# Patient Record
Sex: Female | Born: 1960 | Race: Black or African American | Hispanic: No | Smoking: Never smoker
Health system: Southern US, Community
[De-identification: ages and names within clinical notes are randomized; demographics above are authoritative.]

## PROBLEM LIST (undated history)

## (undated) DIAGNOSIS — I10 Essential (primary) hypertension: Secondary | ICD-10-CM

## (undated) DIAGNOSIS — K219 Gastro-esophageal reflux disease without esophagitis: Secondary | ICD-10-CM

## (undated) DIAGNOSIS — Z973 Presence of spectacles and contact lenses: Secondary | ICD-10-CM

## (undated) HISTORY — DX: Essential (primary) hypertension: I10

---

## 2003-10-30 HISTORY — PX: TOTAL ABDOMINAL HYSTERECTOMY: SHX209

## 2003-10-30 HISTORY — PX: VAGINAL HYSTERECTOMY: SUR661

## 2011-01-23 ENCOUNTER — Ambulatory Visit: Payer: Self-pay | Admitting: Internal Medicine

## 2011-01-26 ENCOUNTER — Ambulatory Visit: Payer: Self-pay | Admitting: Internal Medicine

## 2012-02-21 ENCOUNTER — Ambulatory Visit: Payer: Self-pay | Admitting: Internal Medicine

## 2013-08-20 ENCOUNTER — Ambulatory Visit: Payer: Self-pay | Admitting: Internal Medicine

## 2013-09-22 ENCOUNTER — Ambulatory Visit: Payer: Self-pay | Admitting: Internal Medicine

## 2013-09-28 LAB — HM COLONOSCOPY: HM COLON: NORMAL

## 2013-10-15 ENCOUNTER — Ambulatory Visit: Payer: Self-pay | Admitting: Gastroenterology

## 2013-10-29 HISTORY — PX: COLONOSCOPY: SHX174

## 2014-06-28 LAB — BASIC METABOLIC PANEL
BUN: 12 mg/dL (ref 4–21)
CREATININE: 0.9 mg/dL (ref <=1.1)

## 2014-06-28 LAB — LIPID PANEL
Cholesterol: 196 mg/dL (ref 0–200)
HDL: 52 mg/dL (ref 35–70)
LDL CALC: 123 mg/dL
TRIGLYCERIDES: 105 mg/dL (ref 40–160)

## 2014-06-28 LAB — TSH: TSH: 2.4 u[IU]/mL (ref <=5.90)

## 2014-12-06 ENCOUNTER — Ambulatory Visit: Payer: Self-pay | Admitting: Internal Medicine

## 2014-12-06 LAB — HM MAMMOGRAPHY: HM MAMMO: NORMAL

## 2015-06-14 ENCOUNTER — Ambulatory Visit (INDEPENDENT_AMBULATORY_CARE_PROVIDER_SITE_OTHER): Payer: BLUE CROSS/BLUE SHIELD | Admitting: Family Medicine

## 2015-06-14 ENCOUNTER — Encounter: Payer: Self-pay | Admitting: Family Medicine

## 2015-06-14 VITALS — BP 130/80 | HR 78 | Ht 68.0 in | Wt 241.0 lb

## 2015-06-14 DIAGNOSIS — R1084 Generalized abdominal pain: Secondary | ICD-10-CM

## 2015-06-14 DIAGNOSIS — B9681 Helicobacter pylori [H. pylori] as the cause of diseases classified elsewhere: Secondary | ICD-10-CM

## 2015-06-14 DIAGNOSIS — Z8719 Personal history of other diseases of the digestive system: Secondary | ICD-10-CM | POA: Insufficient documentation

## 2015-06-14 DIAGNOSIS — R760 Raised antibody titer: Secondary | ICD-10-CM

## 2015-06-14 MED ORDER — HYOSCYAMINE SULFATE 0.125 MG SL SUBL: 0.1250 mg | SUBLINGUAL_TABLET | SUBLINGUAL | Status: DC | PRN

## 2015-06-14 NOTE — Patient Instructions (Signed)
Diet and Irritable Bowel Syndrome  No cure has been found for irritable bowel syndrome (IBS). Many options are available to treat the symptoms. Your caregiver will give you the best treatments available for your symptoms. He or she will also encourage you to manage stress and to make changes to your diet. You need to work with your caregiver and Registered Dietician to find the best combination of medicine, diet, counseling, and support to control your symptoms. The following are some diet suggestions. FOODS THAT MAKE IBS WORSE  Fatty foods, such as French fries.  Milk products, such as cheese or ice cream.  Chocolate.  Alcohol.  Caffeine (found in coffee and some sodas).  Carbonated drinks, such as soda. If certain foods cause symptoms, you should eat less of them or stop eating them. FOOD JOURNAL   Keep a journal of the foods that seem to cause distress. Write down:  What you are eating during the day and when.  What problems you are having after eating.  When the symptoms occur in relation to your meals.  What foods always make you feel badly.  Take your notes with you to your caregiver to see if you should stop eating certain foods. FOODS THAT MAKE IBS BETTER Fiber reduces IBS symptoms, especially constipation, because it makes stools soft, bulky, and easier to pass. Fiber is found in bran, bread, cereal, beans, fruit, and vegetables. Examples of foods with fiber include:  Apples.  Peaches.  Pears.  Berries.  Figs.  Broccoli, raw.  Cabbage.  Carrots.  Raw peas.  Kidney beans.  Lima beans.  Whole-grain bread.  Whole-grain cereal. Add foods with fiber to your diet a little at a time. This will let your body get used to them. Too much fiber at once might cause gas and swelling of your abdomen. This can trigger symptoms in a person with IBS. Caregivers usually recommend a diet with enough fiber to produce soft, painless bowel movements. High fiber diets may  cause gas and bloating. However, these symptoms often go away within a few weeks, as your body adjusts. In many cases, dietary fiber may lessen IBS symptoms, particularly constipation. However, it may not help pain or diarrhea. High fiber diets keep the colon mildly enlarged (distended) with the added fiber. This may help prevent spasms in the colon. Some forms of fiber also keep water in the stool, thereby preventing hard stools that are difficult to pass.  Besides telling you to eat more foods with fiber, your caregiver may also tell you to get more fiber by taking a fiber pill or drinking water mixed with a special high fiber powder. An example of this is a natural fiber laxative containing psyllium seed.  TIPS  Large meals can cause cramping and diarrhea in people with IBS. If this happens to you, try eating 4 or 5 small meals a day, or try eating less at each of your usual 3 meals. It may also help if your meals are low in fat and high in carbohydrates. Examples of carbohydrates are pasta, rice, whole-grain breads and cereals, fruits, and vegetables.  If dairy products cause your symptoms to flare up, you can try eating less of those foods. You might be able to handle yogurt better than other dairy products, because it contains bacteria that helps with digestion. Dairy products are an important source of calcium and other nutrients. If you need to avoid dairy products, be sure to talk with a Registered Dietitian about getting these nutrients   through other food sources.  Drink enough water and fluids to keep your urine clear or pale yellow. This is important, especially if you have diarrhea. FOR MORE INFORMATION  International Foundation for Functional Gastrointestinal Disorders: www.iffgd.org  National Digestive Diseases Information Clearinghouse: digestive.niddk.nih.gov Document Released: 01/05/2004 Document Revised: 01/07/2012 Document Reviewed: 01/15/2014 ExitCare Patient Information 2015  [employer]. This information is not intended to replace advice given to you by your health care provider. Make sure you discuss any questions you have with your health care provider.  

## 2015-06-14 NOTE — Progress Notes (Signed)
Name: Cheryl Mosley   MRN: [id number]    DOB: 1961/01/21   Date:06/14/2015       Progress Note  Subjective  Chief Complaint  Chief Complaint  Patient presents with  . Abdominal Pain    "feels like gas"- comes and goes in different places. had a burger and fries- started up    Abdominal Pain This is a recurrent problem. The current episode started 1 to 4 weeks ago. The onset quality is gradual. The problem has been waxing and waning. The pain is located in the left flank and generalized abdominal region. Quality: bloating with gas. The abdominal pain does not radiate. Associated symptoms include belching and flatus. Pertinent negatives include no anorexia, arthralgias, constipation, diarrhea, dysuria, fever, frequency, headaches, hematochezia, hematuria, melena, myalgias, nausea, vomiting or weight loss. The pain is aggravated by eating (certain foods). The pain is relieved by bowel movements. Treatments tried: activia/probiotics. The treatment provided no relief.    No problem-specific assessment & plan notes found for this encounter.   Past Medical History  Diagnosis Date  . Hypertension     Past Surgical History  Procedure Laterality Date  . Vaginal hysterectomy      partial  . Colonoscopy  2015    normal- cleared for 10 yrs- Dr Servando Snare    Family History  Problem Relation Age of Onset  . Hypertension Sister     Social History   Social History  . Marital Status: Married    Spouse Name: N/A  . Number of Children: N/A  . Years of Education: N/A   Occupational History  . Not on file.   Social History Main Topics  . Smoking status: Never Smoker   . Smokeless tobacco: Not on file  . Alcohol Use: No  . Drug Use: No  . Sexual Activity: Yes   Other Topics Concern  . Not on file   Social History Narrative  . No narrative on file    No Known Allergies   Review of Systems  Constitutional: Negative for fever, chills, weight loss and malaise/fatigue.  HENT:  Negative for ear discharge, ear pain and sore throat.   Eyes: Negative for blurred vision.  Respiratory: Negative for cough, sputum production, shortness of breath and wheezing.   Cardiovascular: Negative for chest pain, palpitations and leg swelling.  Gastrointestinal: Positive for abdominal pain and flatus. Negative for heartburn, nausea, vomiting, diarrhea, constipation, blood in stool, melena, hematochezia and anorexia.  Genitourinary: Negative for dysuria, urgency, frequency and hematuria.  Musculoskeletal: Negative for myalgias, back pain, joint pain, arthralgias and neck pain.  Skin: Negative for rash.  Neurological: Negative for dizziness, tingling, sensory change, focal weakness and headaches.  Endo/Heme/Allergies: Negative for environmental allergies and polydipsia. Does not bruise/bleed easily.  Psychiatric/Behavioral: Negative for depression and suicidal ideas. The patient is not nervous/anxious and does not have insomnia.      Objective  Filed Vitals:   06/14/15 1600  BP: 130/80  Pulse: 78  Height: 5\' 8"  (1.727 m)  Weight: 241 lb (109.317 kg)    Physical Exam  Constitutional: She is well-developed, well-nourished, and in no distress. No distress.  HENT:  Head: Normocephalic and atraumatic.  Right Ear: External ear normal.  Left Ear: External ear normal.  Nose: Nose normal.  Mouth/Throat: Oropharynx is clear and moist.  Eyes: Conjunctivae and EOM are normal. Pupils are equal, round, and reactive to light. Right eye exhibits no discharge. Left eye exhibits no discharge.  Neck: Normal range of motion. Neck supple.  No JVD present. No thyromegaly present.  Cardiovascular: Normal rate, regular rhythm, normal heart sounds and intact distal pulses.  Exam reveals no gallop and no friction rub.   No murmur heard. Pulmonary/Chest: Effort normal and breath sounds normal.  Abdominal: Soft. Bowel sounds are normal. She exhibits no mass. There is no tenderness. There is no  guarding.  Musculoskeletal: Normal range of motion. She exhibits no edema.  Lymphadenopathy:    She has no cervical adenopathy.  Neurological: She is alert. She has normal reflexes.  Skin: Skin is warm and dry. She is not diaphoretic.  Psychiatric: Mood and affect normal.  Nursing note and vitals reviewed.     Assessment & Plan  Problem List Items Addressed This Visit    None    Visit Diagnoses    Generalized abdominal pain    -  Primary    Relevant Medications    hyoscyamine (LEVSIN/SL) 0.125 MG SL tablet    Other Relevant Orders    POCT Urinalysis Dipstick    Hepatic function panel    Lipase    H. pylori antibody, IgG         Dr. Hayden [facility]  06/14/2015

## 2015-06-15 ENCOUNTER — Encounter: Payer: Self-pay | Admitting: Internal Medicine

## 2015-06-15 ENCOUNTER — Ambulatory Visit: Payer: Self-pay | Admitting: Family Medicine

## 2015-06-15 LAB — H. PYLORI ANTIBODY, IGG: H PYLORI IGG: 2 U/mL — AB (ref 0.0–0.8)

## 2015-06-15 LAB — HEPATIC FUNCTION PANEL
ALBUMIN: 4.1 g/dL (ref 3.5–5.5)
ALT: 21 IU/L (ref 0–32)
AST: 24 IU/L (ref 0–40)
Alkaline Phosphatase: 78 IU/L (ref 39–117)
BILIRUBIN TOTAL: 0.5 mg/dL (ref 0.0–1.2)
Bilirubin, Direct: 0.16 mg/dL (ref 0.00–0.40)
TOTAL PROTEIN: 6.9 g/dL (ref 6.0–8.5)

## 2015-06-15 LAB — LIPASE: Lipase: 23 U/L (ref 0–59)

## 2015-06-16 ENCOUNTER — Encounter: Payer: Self-pay | Admitting: Internal Medicine

## 2015-06-16 DIAGNOSIS — I10 Essential (primary) hypertension: Secondary | ICD-10-CM | POA: Insufficient documentation

## 2015-06-16 DIAGNOSIS — E782 Mixed hyperlipidemia: Secondary | ICD-10-CM | POA: Insufficient documentation

## 2015-06-16 DIAGNOSIS — M26659 Arthropathy of unspecified temporomandibular joint: Secondary | ICD-10-CM | POA: Insufficient documentation

## 2015-06-16 DIAGNOSIS — M26609 Unspecified temporomandibular joint disorder, unspecified side: Secondary | ICD-10-CM

## 2015-06-16 MED ORDER — AMOXICILLIN 500 MG PO CAPS: 500.0000 mg | ORAL_CAPSULE | Freq: Three times a day (TID) | ORAL | Status: DC

## 2015-06-16 MED ORDER — METRONIDAZOLE 500 MG PO TABS: 500.0000 mg | ORAL_TABLET | Freq: Three times a day (TID) | ORAL | Status: DC

## 2015-06-16 MED ORDER — OMEPRAZOLE 40 MG PO CPDR: 40.0000 mg | DELAYED_RELEASE_CAPSULE | Freq: Every day | ORAL | Status: DC

## 2015-06-16 NOTE — Addendum Note (Signed)
Addended by: Arthur Holms L on: 06/16/2015 11:15 AM   Modules accepted: Orders

## 2015-07-06 ENCOUNTER — Encounter: Payer: Self-pay | Admitting: Internal Medicine

## 2015-07-06 ENCOUNTER — Ambulatory Visit (INDEPENDENT_AMBULATORY_CARE_PROVIDER_SITE_OTHER): Payer: BLUE CROSS/BLUE SHIELD | Admitting: Internal Medicine

## 2015-07-06 VITALS — BP 122/76 | HR 76 | Ht 68.0 in | Wt 236.6 lb

## 2015-07-06 DIAGNOSIS — K2970 Gastritis, unspecified, without bleeding: Secondary | ICD-10-CM

## 2015-07-06 DIAGNOSIS — B9681 Helicobacter pylori [H. pylori] as the cause of diseases classified elsewhere: Secondary | ICD-10-CM

## 2015-07-06 DIAGNOSIS — I10 Essential (primary) hypertension: Secondary | ICD-10-CM | POA: Diagnosis not present

## 2015-07-06 DIAGNOSIS — K625 Hemorrhage of anus and rectum: Secondary | ICD-10-CM | POA: Diagnosis not present

## 2015-07-06 MED ORDER — AMLODIPINE BESYLATE 5 MG PO TABS: 5.0000 mg | ORAL_TABLET | Freq: Every day | ORAL | Status: DC

## 2015-07-06 MED ORDER — BENICAR HCT 40-25 MG PO TABS
1.0000 | ORAL_TABLET | Freq: Every day | ORAL | Status: DC
Start: 2015-07-06 — End: 2015-07-07

## 2015-07-06 NOTE — Progress Notes (Signed)
Date:  07/06/2015   Name:  Cheryl Mosley   DOB:  06-11-1961   MRN:  [id number]   Chief Complaint: Karie Fetch Reflux She reports no abdominal pain, no chest pain or no coughing. Patient was seen several weeks ago with abdominal discomfort H. pylori was positive she completed her antibiotics. She continues to take Prilosec daily. She says she is at least 75% improved.. Pertinent negatives include no fatigue. She continues to see scant bright red blood after a bowel movement. This is concerning to her although she had a normal colonoscopy last year. She was given stool cards but forgot to send those in..  Hypertension This is a chronic problem. The current episode started more than 1 year ago. The problem is unchanged. The problem is controlled. Pertinent negatives include no chest pain or shortness of breath. Past treatments include calcium channel blockers.   currently on Benicar and amlodipine. It appears that the Benicar is no longer on her formulary and a prior approval will need to be done or an alternative medication chosen.   Review of Systems:  Review of Systems  Constitutional: Negative for fever, diaphoresis and fatigue.  Respiratory: Negative for cough, shortness of breath and stridor.   Cardiovascular: Negative for chest pain and leg swelling.  Gastrointestinal: Positive for blood in stool. Negative for abdominal pain, diarrhea, constipation and abdominal distention.    Patient Active Problem List   Diagnosis Date Noted  . Essential (primary) hypertension 06/16/2015  . Combined fat and carbohydrate induced hyperlipemia 06/16/2015  . 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 Helicobacter pylori gastritis 06/14/2015    Prior to Admission medications   Medication Sig Start Date End Date Taking? Authorizing Provider  amLODipine (NORVASC) 5 MG tablet Take 1 tablet by mouth daily. 04/14/15  Yes Historical Provider, MD  BENICAR HCT 40-25 MG per tablet Take 1 tablet by  mouth daily. 04/14/15  Yes Historical Provider, MD  hyoscyamine (LEVSIN/SL) 0.125 MG SL tablet Place 1 tablet (0.125 mg total) under the tongue every 4 (four) hours as needed. 06/14/15  Yes Duanne Limerick, MD  omeprazole (PRILOSEC) 40 MG capsule Take 1 capsule (40 mg total) by mouth daily. 06/16/15  Yes Duanne Limerick, MD    No Known Allergies  Past Surgical History  Procedure Laterality Date  . Vaginal hysterectomy      partial  . Colonoscopy  2015    normal- cleared for 10 yrs- Dr Servando Snare    Social History  Substance Use Topics  . Smoking status: Never Smoker   . Smokeless tobacco: None  . Alcohol Use: No     Medication list has been reviewed and updated.  Physical Examination:  Physical Exam  Constitutional: She is oriented to person, place, and time. She appears well-developed. No distress.  HENT:  Head: Normocephalic and atraumatic.  Eyes: Conjunctivae are normal. Right eye exhibits no discharge. Left eye exhibits no discharge. No scleral icterus.  Cardiovascular: Normal rate, regular rhythm, S1 normal and normal heart sounds.   Pulmonary/Chest: Effort normal and breath sounds normal. No respiratory distress.  Abdominal: Soft. Normal appearance and bowel sounds are normal. There is no hepatosplenomegaly. There is no tenderness.  Musculoskeletal: Normal range of motion. She exhibits no edema.  Neurological: She is alert and oriented to person, place, and time.  Skin: Skin is warm and dry. No rash noted.  Psychiatric: She has a normal mood and affect. Her behavior is normal. Thought content normal.  Nursing note and vitals reviewed.  BP 122/76 mmHg  Pulse 76  Ht  (1.727 m)  Wt 236 lb 9.6 oz (107.321 kg)  BMI 35.98 kg/m2  Assessment and Plan: 1. Essential (primary) hypertension Controlled. Will obtain PA or substitute therapy for Benicar HCT. - amLODipine (NORVASC) 5 MG tablet; Take 1 tablet (5 mg total) by mouth daily.  Dispense: 30 tablet; Refill: 12 - BENICAR  HCT 40-25 MG per tablet; Take 1 tablet by mouth daily.  Dispense: 30 tablet; Refill: 12  2. Helicobacter pylori gastritis Antibiotics are completed. Continue omeprazole 40 mg daily for the next 3 months.  3. Rectal bleeding Continue to monitor for bleeding. If persistent will refer to GI for further evaluation. Patient is reassured that recent colonoscopy was normal   [name], MD [facility]  07/06/2015  [name], MD [facility]  07/06/2015

## 2015-07-07 ENCOUNTER — Other Ambulatory Visit: Payer: Self-pay | Admitting: Internal Medicine

## 2015-07-07 MED ORDER — VALSARTAN-HYDROCHLOROTHIAZIDE 320-25 MG PO TABS: 1.0000 | ORAL_TABLET | Freq: Every day | ORAL | Status: DC

## 2015-07-07 NOTE — Progress Notes (Signed)
Patient informed.dr 

## 2015-08-29 ENCOUNTER — Ambulatory Visit (INDEPENDENT_AMBULATORY_CARE_PROVIDER_SITE_OTHER): Payer: BLUE CROSS/BLUE SHIELD | Admitting: Internal Medicine

## 2015-08-29 ENCOUNTER — Encounter: Payer: Self-pay | Admitting: Internal Medicine

## 2015-08-29 VITALS — BP 152/98 | HR 80 | Ht 68.0 in | Wt 237.4 lb

## 2015-08-29 DIAGNOSIS — Z23 Encounter for immunization: Secondary | ICD-10-CM | POA: Diagnosis not present

## 2015-08-29 DIAGNOSIS — Z0000 Encounter for general adult medical examination without abnormal findings: Secondary | ICD-10-CM

## 2015-08-29 DIAGNOSIS — Z1239 Encounter for other screening for malignant neoplasm of breast: Secondary | ICD-10-CM

## 2015-08-29 DIAGNOSIS — E782 Mixed hyperlipidemia: Secondary | ICD-10-CM

## 2015-08-29 DIAGNOSIS — I10 Essential (primary) hypertension: Secondary | ICD-10-CM

## 2015-08-29 NOTE — Progress Notes (Signed)
Date:  08/29/2015   Name:  Cheryl Mosley   DOB:  1961-04-28   MRN:  [id number]   Chief Complaint: Annual Exam and Hypertension Cheryl Mosley is a 54 y.o. female who presents today for her Complete Annual Exam. She feels well. She reports exercising intermittently. She reports she is sleeping well. She denies any breast complaints. She is due for a mammogram. Hypertension This is a chronic problem. The current episode started more than 1 year ago. The problem is unchanged. The problem is controlled (Recently changed medications from Benicar HCT to Diovan HCT. During this transition she forgot to take amlodipine 5 mg). Pertinent negatives include no chest pain, headaches, palpitations or shortness of breath. Past treatments include angiotensin blockers and diuretics (Benicar is no longer covered on her insurance plan. Earlier this year she was switched to Benicar HCT and amlodipine. Prior to that she was on  Tribenzor). The current treatment provides moderate improvement.  Hyperlipidemia This is a chronic problem. The current episode started more than 1 year ago. The problem is controlled. Recent lipid tests were reviewed and are normal. There are no known factors aggravating her hyperlipidemia. Pertinent negatives include no chest pain, focal sensory loss, leg pain, myalgias or shortness of breath. Current antihyperlipidemic treatment includes statins. The current treatment provides significant improvement of lipids. There are no compliance problems.       Review of Systems  Constitutional: Negative for fever, chills and fatigue.  HENT: Negative for ear pain, postnasal drip, sinus pressure, sneezing, trouble swallowing and voice change.   Eyes: Negative for visual disturbance.  Respiratory: Negative for cough, chest tightness, shortness of breath and wheezing.   Cardiovascular: Negative for chest pain, palpitations and leg swelling.  Gastrointestinal: Negative for nausea, abdominal pain and  constipation.  Genitourinary: Negative for dysuria, hematuria, vaginal bleeding and vaginal discharge.  Musculoskeletal: Negative for myalgias, back pain, arthralgias and gait problem.  Skin: Negative for color change and rash.  Neurological: Negative for tremors, weakness, light-headedness and headaches.  Hematological: Negative for adenopathy.  Psychiatric/Behavioral: Negative for sleep disturbance and dysphoric mood.    Patient Active Problem List   Diagnosis Date Noted  . Essential (primary) hypertension 06/16/2015  . Combined fat and carbohydrate induced hyperlipemia 06/16/2015  . 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 Helicobacter pylori gastritis 06/14/2015    Prior to Admission medications   Medication Sig Start Date End Date Taking? Authorizing Provider  omeprazole (PRILOSEC) 40 MG capsule Take 1 capsule (40 mg total) by mouth daily. 06/16/15  Yes Duanne Limerick, MD  valsartan-hydrochlorothiazide (DIOVAN-HCT) 320-25 MG per tablet Take 1 tablet by mouth daily. 07/07/15  Yes Reubin Milan, MD  amLODipine (NORVASC) 5 MG tablet Take 1 tablet (5 mg total) by mouth daily. Patient not taking: Reported on 08/29/2015 07/06/15   Reubin Milan, MD  hyoscyamine (LEVSIN/SL) 0.125 MG SL tablet Place 1 tablet (0.125 mg total) under the tongue every 4 (four) hours as needed. Patient not taking: Reported on 08/29/2015 06/14/15   Duanne Limerick, MD    No Known Allergies  Past Surgical History  Procedure Laterality Date  . Vaginal hysterectomy      partial  . Colonoscopy  2015    normal- cleared for 10 yrs- Dr Servando Snare    Social History  Substance Use Topics  . Smoking status: Never Smoker   . Smokeless tobacco: None  . Alcohol Use: No    Medication list has been reviewed and updated.   Physical  Exam  Constitutional: She is oriented to person, place, and time. She appears well-developed and well-nourished. No distress.  HENT:  Head: Normocephalic and atraumatic.   Right Ear: Tympanic membrane and ear canal normal.  Left Ear: Tympanic membrane and ear canal normal.  Nose: Nose normal. Right sinus exhibits no maxillary sinus tenderness. Left sinus exhibits no maxillary sinus tenderness.  Mouth/Throat: Uvula is midline and oropharynx is clear and moist.  Eyes: Conjunctivae and EOM are normal. Right eye exhibits no discharge. Left eye exhibits no discharge. No scleral icterus.  Neck: Normal range of motion. Carotid bruit is not present. No erythema present. No thyromegaly present.  Cardiovascular: Normal rate, regular rhythm, normal heart sounds and normal pulses.   Pulmonary/Chest: Effort normal. No respiratory distress. She has no wheezes. Right breast exhibits no mass, no nipple discharge, no skin change and no tenderness. Left breast exhibits no mass, no nipple discharge, no skin change and no tenderness.  Abdominal: Soft. Bowel sounds are normal. There is no hepatosplenomegaly. There is no tenderness. There is no CVA tenderness.  Musculoskeletal: Normal range of motion.  Lymphadenopathy:    She has no cervical adenopathy.    She has no axillary adenopathy.  Neurological: She is alert and oriented to person, place, and time. She has normal reflexes. No cranial nerve deficit or sensory deficit.  Skin: Skin is warm, dry and intact. No rash noted.  Psychiatric: She has a normal mood and affect. Her speech is normal and behavior is normal. Thought content normal.  Nursing note and vitals reviewed.   BP 152/98 mmHg  Pulse 80  Ht 5\' 8"  (1.727 m)  Wt 237 lb 6.4 oz (107.684 kg)  BMI 36.10 kg/m2  Assessment and Plan: 1. Annual physical exam Pap smear not needed due to hysterectomy Colonoscopy is up-to-date  2. Breast cancer screening Continue self breast exams - MM DIGITAL SCREENING BILATERAL; Future  3. Essential (primary) hypertension Not controlled - continue Diovan HCT and resume amlodipine  - CBC with Differential/Platelet - Comprehensive  metabolic panel - TSH  4. Combined fat and carbohydrate induced hyperlipemia Borderline elevated LDL in the past Continue healthy diet - Lipid panel  5. Need for influenza vaccination - Flu Vaccine QUAD 36+ mos IM   Bari EdwardLaura Berglund, MD [facility]  08/29/2015

## 2015-08-30 LAB — CBC WITH DIFFERENTIAL/PLATELET
Basophils Absolute: 0 10*3/uL (ref 0.0–0.2)
Basos: 0 %
EOS (ABSOLUTE): 0.1 10*3/uL (ref 0.0–0.4)
EOS: 2 %
HEMATOCRIT: 40.6 % (ref 34.0–46.6)
HEMOGLOBIN: 13.3 g/dL (ref 11.1–15.9)
IMMATURE GRANS (ABS): 0 10*3/uL (ref 0.0–0.1)
IMMATURE GRANULOCYTES: 0 %
LYMPHS ABS: 1.5 10*3/uL (ref 0.7–3.1)
LYMPHS: 38 %
MCH: 28.2 pg (ref 26.6–33.0)
MCHC: 32.8 g/dL (ref 31.5–35.7)
MCV: 86 fL (ref 79–97)
MONOCYTES: 9 %
Monocytes Absolute: 0.3 10*3/uL (ref 0.1–0.9)
Neutrophils Absolute: 2 10*3/uL (ref 1.4–7.0)
Neutrophils: 51 %
Platelets: 309 10*3/uL (ref 150–379)
RBC: 4.72 x10E6/uL (ref 3.77–5.28)
RDW: 15.3 % (ref 12.3–15.4)
WBC: 3.9 10*3/uL (ref 3.4–10.8)

## 2015-08-30 LAB — POCT URINALYSIS DIPSTICK
BILIRUBIN UA: NEGATIVE
Glucose, UA: NEGATIVE
KETONES UA: NEGATIVE
Leukocytes, UA: NEGATIVE
Nitrite, UA: NEGATIVE
PH UA: 5
PROTEIN UA: NEGATIVE
RBC UA: NEGATIVE
SPEC GRAV UA: 1.02
Urobilinogen, UA: 0.2

## 2015-08-30 LAB — COMPREHENSIVE METABOLIC PANEL
ALBUMIN: 4.1 g/dL (ref 3.5–5.5)
ALT: 20 IU/L (ref 0–32)
AST: 21 IU/L (ref 0–40)
Albumin/Globulin Ratio: 1.3 (ref 1.1–2.5)
Alkaline Phosphatase: 80 IU/L (ref 39–117)
BUN / CREAT RATIO: 18 (ref 9–23)
BUN: 13 mg/dL (ref 6–24)
Bilirubin Total: 0.9 mg/dL (ref 0.0–1.2)
CALCIUM: 9.5 mg/dL (ref 8.7–10.2)
CO2: 27 mmol/L (ref 18–29)
CREATININE: 0.73 mg/dL (ref 0.57–1.00)
Chloride: 99 mmol/L (ref 97–106)
GFR, EST AFRICAN AMERICAN: 109 mL/min/{1.73_m2} (ref >59)
GFR, EST NON AFRICAN AMERICAN: 94 mL/min/{1.73_m2} (ref >59)
GLOBULIN, TOTAL: 3.1 g/dL (ref 1.5–4.5)
GLUCOSE: 86 mg/dL (ref 65–99)
Potassium: 4.1 mmol/L (ref 3.5–5.2)
SODIUM: 140 mmol/L (ref 136–144)
TOTAL PROTEIN: 7.2 g/dL (ref 6.0–8.5)

## 2015-08-30 LAB — LIPID PANEL
CHOL/HDL RATIO: 3.5 ratio (ref 0.0–4.4)
Cholesterol, Total: 213 mg/dL — ABNORMAL HIGH (ref 100–199)
HDL: 61 mg/dL (ref >39)
LDL CALC: 138 mg/dL — AB (ref 0–99)
Triglycerides: 69 mg/dL (ref 0–149)

## 2015-08-30 LAB — TSH: TSH: 2.45 u[IU]/mL (ref 0.450–4.500)

## 2015-09-07 ENCOUNTER — Telehealth: Payer: Self-pay

## 2015-09-07 ENCOUNTER — Other Ambulatory Visit: Payer: Self-pay | Admitting: Internal Medicine

## 2015-09-07 DIAGNOSIS — K2970 Gastritis, unspecified, without bleeding: Principal | ICD-10-CM

## 2015-09-07 DIAGNOSIS — B9681 Helicobacter pylori [H. pylori] as the cause of diseases classified elsewhere: Secondary | ICD-10-CM

## 2015-09-07 NOTE — Telephone Encounter (Signed)
She was seen in August with positive H Pylori Ab and treated. She needs to come in to discuss her symptoms exactly so I can decide the next step.

## 2015-09-07 NOTE — Telephone Encounter (Signed)
Patient called, abdominal pain still uncomfortable, wants to get ultrasound or whatever you feel appropriate next.dr

## 2015-09-14 ENCOUNTER — Ambulatory Visit
Admission: RE | Admit: 2015-09-14 | Discharge: 2015-09-14 | Disposition: A | Discharge status: Home / Self Care | Payer: BLUE CROSS/BLUE SHIELD | Source: Ambulatory Visit | Attending: Internal Medicine | Admitting: Internal Medicine

## 2015-09-14 DIAGNOSIS — K2970 Gastritis, unspecified, without bleeding: Principal | ICD-10-CM

## 2015-09-14 DIAGNOSIS — B9681 Helicobacter pylori [H. pylori] as the cause of diseases classified elsewhere: Secondary | ICD-10-CM

## 2015-09-14 DIAGNOSIS — K449 Diaphragmatic hernia without obstruction or gangrene: Secondary | ICD-10-CM | POA: Diagnosis not present

## 2015-09-14 DIAGNOSIS — K219 Gastro-esophageal reflux disease without esophagitis: Secondary | ICD-10-CM | POA: Diagnosis not present

## 2015-09-14 DIAGNOSIS — R109 Unspecified abdominal pain: Secondary | ICD-10-CM | POA: Insufficient documentation

## 2015-09-15 ENCOUNTER — Other Ambulatory Visit: Payer: Self-pay | Admitting: Internal Medicine

## 2015-09-15 DIAGNOSIS — R1013 Epigastric pain: Secondary | ICD-10-CM

## 2015-09-16 ENCOUNTER — Other Ambulatory Visit: Payer: Self-pay

## 2015-09-16 ENCOUNTER — Encounter: Payer: Self-pay | Admitting: *Deleted

## 2015-09-19 ENCOUNTER — Other Ambulatory Visit: Payer: Self-pay | Admitting: Gastroenterology

## 2015-09-19 ENCOUNTER — Ambulatory Visit: Payer: BLUE CROSS/BLUE SHIELD | Admitting: Anesthesiology

## 2015-09-19 ENCOUNTER — Encounter
Admission: RE | Disposition: A | Discharge status: Home / Self Care | Payer: Self-pay | Source: Ambulatory Visit | Attending: Gastroenterology

## 2015-09-19 ENCOUNTER — Encounter: Payer: Self-pay | Admitting: *Deleted

## 2015-09-19 ENCOUNTER — Ambulatory Visit
Admission: RE | Admit: 2015-09-19 | Discharge: 2015-09-19 | Disposition: A | Discharge status: Home / Self Care | Payer: BLUE CROSS/BLUE SHIELD | Source: Ambulatory Visit | Attending: Gastroenterology | Admitting: Gastroenterology

## 2015-09-19 DIAGNOSIS — K2970 Gastritis, unspecified, without bleeding: Secondary | ICD-10-CM | POA: Insufficient documentation

## 2015-09-19 DIAGNOSIS — A048 Other specified bacterial intestinal infections: Secondary | ICD-10-CM | POA: Insufficient documentation

## 2015-09-19 DIAGNOSIS — K2289 Other specified disease of esophagus: Secondary | ICD-10-CM | POA: Insufficient documentation

## 2015-09-19 DIAGNOSIS — Z90710 Acquired absence of both cervix and uterus: Secondary | ICD-10-CM | POA: Insufficient documentation

## 2015-09-19 DIAGNOSIS — K228 Other specified diseases of esophagus: Secondary | ICD-10-CM

## 2015-09-19 DIAGNOSIS — I10 Essential (primary) hypertension: Secondary | ICD-10-CM | POA: Insufficient documentation

## 2015-09-19 DIAGNOSIS — Z79899 Other long term (current) drug therapy: Secondary | ICD-10-CM | POA: Diagnosis not present

## 2015-09-19 DIAGNOSIS — K219 Gastro-esophageal reflux disease without esophagitis: Secondary | ICD-10-CM | POA: Diagnosis not present

## 2015-09-19 DIAGNOSIS — R12 Heartburn: Secondary | ICD-10-CM | POA: Insufficient documentation

## 2015-09-19 HISTORY — DX: Presence of spectacles and contact lenses: Z97.3

## 2015-09-19 HISTORY — PX: ESOPHAGOGASTRODUODENOSCOPY (EGD) WITH PROPOFOL: SHX5813

## 2015-09-19 HISTORY — DX: Gastro-esophageal reflux disease without esophagitis: K21.9

## 2015-09-19 SURGERY — ESOPHAGOGASTRODUODENOSCOPY (EGD) WITH PROPOFOL
Anesthesia: Monitor Anesthesia Care | Wound class: Clean Contaminated

## 2015-09-19 MED ORDER — LACTATED RINGERS IV SOLN
INTRAVENOUS | Status: DC
  Administered 2015-09-19 (×2): via INTRAVENOUS

## 2015-09-19 MED ORDER — PROPOFOL 10 MG/ML IV BOLUS
INTRAVENOUS | Status: DC | PRN
  Administered 2015-09-19 (×2): 25 mg via INTRAVENOUS
  Administered 2015-09-19: 100 mg via INTRAVENOUS

## 2015-09-19 MED ORDER — ACETAMINOPHEN 325 MG PO TABS: 325.0000 mg | ORAL_TABLET | ORAL | Status: DC | PRN

## 2015-09-19 MED ORDER — LIDOCAINE HCL (CARDIAC) 20 MG/ML IV SOLN
INTRAVENOUS | Status: DC | PRN
  Administered 2015-09-19: 20 mg via INTRAVENOUS

## 2015-09-19 MED ORDER — STERILE WATER FOR IRRIGATION IR SOLN
Status: DC | PRN
  Administered 2015-09-19: 50 mL

## 2015-09-19 MED ORDER — GLYCOPYRROLATE 0.2 MG/ML IJ SOLN
INTRAMUSCULAR | Status: DC | PRN
  Administered 2015-09-19: 0.2 mg via INTRAVENOUS

## 2015-09-19 MED ORDER — ACETAMINOPHEN 160 MG/5ML PO SOLN: 325.0000 mg | ORAL | Status: DC | PRN

## 2015-09-19 SURGICAL SUPPLY — 39 items
BALLN DILATOR 10-12 8 (BALLOONS)
BALLN DILATOR 12-15 8 (BALLOONS)
BALLN DILATOR 15-18 8 (BALLOONS)
BALLN DILATOR CRE 0-12 8 (BALLOONS)
BALLN DILATOR ESOPH 8 10 CRE (MISCELLANEOUS) IMPLANT
BALLOON DILATOR 12-15 8 (BALLOONS) IMPLANT
BALLOON DILATOR 15-18 8 (BALLOONS) IMPLANT
BALLOON DILATOR CRE 0-12 8 (BALLOONS) IMPLANT
BLOCK BITE 60FR ADLT L/F GRN (MISCELLANEOUS) ×2 IMPLANT
CANISTER SUCT 1200ML W/VALVE (MISCELLANEOUS) ×2 IMPLANT
FCP ESCP3.2XJMB 240X2.8X (MISCELLANEOUS)
FORCEPS BIOP RAD 4 LRG CAP 4 (CUTTING FORCEPS) ×2 IMPLANT
FORCEPS BIOP RJ4 240 W/NDL (MISCELLANEOUS)
FORCEPS ESCP3.2XJMB 240X2.8X (MISCELLANEOUS) IMPLANT
GOWN CVR UNV OPN BCK APRN NK (MISCELLANEOUS) ×2 IMPLANT
GOWN ISOL THUMB LOOP REG UNIV (MISCELLANEOUS) ×2
HEMOCLIP INSTINCT (CLIP) IMPLANT
INJECTOR VARIJECT VIN23 (MISCELLANEOUS) IMPLANT
KIT CO2 TUBING (TUBING) IMPLANT
KIT DEFENDO VALVE AND CONN (KITS) IMPLANT
KIT ENDO PROCEDURE OLY (KITS) ×2 IMPLANT
LIGATOR MULTIBAND 6SHOOTER MBL (MISCELLANEOUS) IMPLANT
MARKER SPOT ENDO TATTOO 5ML (MISCELLANEOUS) IMPLANT
PAD GROUND ADULT SPLIT (MISCELLANEOUS) IMPLANT
SNARE SHORT THROW 13M SML OVAL (MISCELLANEOUS) IMPLANT
SNARE SHORT THROW 30M LRG OVAL (MISCELLANEOUS) IMPLANT
SPOT EX ENDOSCOPIC TATTOO (MISCELLANEOUS)
SUCTION POLY TRAP 4CHAMBER (MISCELLANEOUS) IMPLANT
SYR INFLATION 60ML (SYRINGE) IMPLANT
TRAP SUCTION POLY (MISCELLANEOUS) IMPLANT
TUBING CONN 6MMX3.1M (TUBING)
TUBING SUCTION CONN 0.25 STRL (TUBING) IMPLANT
UNDERPAD 30X60 958B10 (PK) (MISCELLANEOUS) IMPLANT
VALVE BIOPSY ENDO (VALVE) IMPLANT
VARIJECT INJECTOR VIN23 (MISCELLANEOUS)
WATER AUXILLARY (MISCELLANEOUS) IMPLANT
WATER STERILE IRR 250ML POUR (IV SOLUTION) ×2 IMPLANT
WATER STERILE IRR 500ML POUR (IV SOLUTION) IMPLANT
WIRE CRE 18-20MM 8CM F G (MISCELLANEOUS) IMPLANT

## 2015-09-19 NOTE — H&P (Signed)
  [facility]  [address] [place], [place] [address] Phone: [phone] Fax : [phone]  Primary Care Physician:  Bari EdwardLaura Berglund, MD Primary Gastroenterologist:  Dr. Servando SnareWohl  Pre-Procedure History & Physical: HPI:  Cheryl Mosley is a 54 y.o. female is here for an endoscopy.   Past Medical History  Diagnosis Date  . Hypertension   . GERD (gastroesophageal reflux disease)   . Wears contact lenses     Past Surgical History  Procedure Laterality Date  . Vaginal hysterectomy      partial  . Colonoscopy  2015    normal- cleared for 10 yrs- Dr Servando SnareWohl    Prior to Admission medications   Medication Sig Start Date End Date Taking? Authorizing Provider  amLODipine (NORVASC) 5 MG tablet Take 1 tablet (5 mg total) by mouth daily. 07/06/15  Yes Reubin MilanLaura H Berglund, MD  Multiple Vitamins-Minerals (CENTRUM ADULTS PO) Take by mouth.   Yes Historical Provider, MD  valsartan-hydrochlorothiazide (DIOVAN-HCT) 320-25 MG per tablet Take 1 tablet by mouth daily. 07/07/15  Yes Reubin MilanLaura H Berglund, MD  hyoscyamine (LEVSIN/SL) 0.125 MG SL tablet Place 1 tablet (0.125 mg total) under the tongue every 4 (four) hours as needed. 06/14/15   Duanne Limerickeanna C Jones, MD  omeprazole (PRILOSEC) 40 MG capsule Take 1 capsule (40 mg total) by mouth daily. Patient not taking: Reported on 09/16/2015 06/16/15   Duanne Limerickeanna C Jones, MD    Allergies as of 09/16/2015  . (No Known Allergies)    Family History  Problem Relation Age of Onset  . Hypertension Sister     Social History   Social History  . Marital Status: Married    Spouse Name: N/A  . Number of Children: N/A  . Years of Education: N/A   Occupational History  . Not on file.   Social History Main Topics  . Smoking status: Never Smoker   . Smokeless tobacco: Not on file  . Alcohol Use: No  . Drug Use: No  . Sexual Activity: Yes   Other Topics Concern  . Not on file   Social History Narrative    Review of Systems: See HPI, otherwise  negative ROS  Physical Exam: BP 122/89 mmHg  Pulse 85  Temp(Src) 97.9 F (36.6 C)  Resp 16  Ht 5\' 8"  (1.727 m)  Wt 230 lb (104.327 kg)  BMI 34.98 kg/m2  SpO2 100% General:   Alert,  pleasant and cooperative in NAD Head:  Normocephalic and atraumatic. Neck:  Supple; no masses or thyromegaly. Lungs:  Clear throughout to auscultation.    Heart:  Regular rate and rhythm. Abdomen:  Soft, nontender and nondistended. Normal bowel sounds, without guarding, and without rebound.   Neurologic:  Alert and  oriented x4;  grossly normal neurologically.  Impression/Plan: Cheryl Mosley is here for an endoscopy to be performed for GERD  Risks, benefits, limitations, and alternatives regarding  endoscopy have been reviewed with the patient.  Questions have been answered.  All parties agreeable.   Darlina Rumpfaren Mahad Newstrom, MD  09/19/2015, 11:33 AM

## 2015-09-19 NOTE — Op Note (Signed)
[facility] Gastroenterology Patient Name: Cheryl Mosley Procedure Date: 09/19/2015 11:05 AM MRN: [id number] Account #: [id number] Date of Birth: 03/18/1961 Admit Type: Outpatient Age: [age] Room: [facility] ROOM 01 Gender: Female Note Status: Finalized Procedure:         Upper GI endoscopy Indications:       Heartburn Providers:         Midge Miniumarren Deshawn Skelley, MD Referring MD:      Bari EdwardLaura Berglund, MD (Referring MD) Medicines:         Propofol per Anesthesia Complications:     No immediate complications. Procedure:         Pre-Anesthesia Assessment:                    - Prior to the procedure, a History and Physical was                     performed, and patient medications and allergies were                     reviewed. The patient's tolerance of previous anesthesia                     was also reviewed. The risks and benefits of the procedure                     and the sedation options and risks were discussed with the                     patient. All questions were answered, and informed consent                     was obtained. Prior Anticoagulants: The patient has taken                     no previous anticoagulant or antiplatelet agents. ASA                     Grade Assessment: II - A patient with mild systemic                     disease. After reviewing the risks and benefits, the                     patient was deemed in satisfactory condition to undergo                     the procedure.                    After obtaining informed consent, the endoscope was passed                     under direct vision. Throughout the procedure, the                     patient's blood pressure, pulse, and oxygen saturations                     were monitored continuously. The was introduced through                     the mouth, and advanced to the second part of duodenum.  The upper GI endoscopy was accomplished without                     difficulty. The  patient tolerated the procedure well. Findings:      The Z-line was irregular and was found at the gastroesophageal junction.      Localized mild inflammation characterized by erythema was found in the       gastric antrum. Biopsies were taken with a cold forceps for histology.      The examined duodenum was normal. Impression:        - Z-line irregular, at the gastroesophageal junction.                    - Gastritis. Biopsied.                    - Normal examined duodenum. Recommendation:    - Await pathology results. Procedure Code(s): --- Professional ---                    [phone], Esophagogastroduodenoscopy, flexible, transoral;                     with biopsy, single or multiple Diagnosis Code(s): --- Professional ---                    R12, Heartburn                    K22.8, Other specified diseases of esophagus                    K29.70, Gastritis, unspecified, without bleeding CPT copyright 2014 American Medical Association. All rights reserved. The codes documented in this report are preliminary and upon coder review may  be revised to meet current compliance requirements. Midge Minium, MD 09/19/2015 11:50:41 AM This report has been signed electronically. Number of Addenda: 0 Note Initiated On: 09/19/2015 11:05 AM      [facility]

## 2015-09-19 NOTE — Anesthesia Preprocedure Evaluation (Signed)
Anesthesia Evaluation  Patient identified by MRN, date of birth, ID band  Reviewed: Allergy & Precautions, H&P , NPO status , Patient's Chart, lab work & pertinent test results  Airway Mallampati: II  TM Distance: >3 FB Neck ROM: full    Dental no notable dental hx.    Pulmonary    Pulmonary exam normal       Cardiovascular hypertension, Rhythm:regular Rate:Normal     Neuro/Psych    GI/Hepatic GERD-  ,  Endo/Other    Renal/GU      Musculoskeletal   Abdominal   Peds  Hematology   Anesthesia Other Findings   Reproductive/Obstetrics                             Anesthesia Physical Anesthesia Plan  ASA: II  Anesthesia Plan: MAC   Post-op Pain Management:    Induction:   Airway Management Planned:   Additional Equipment:   Intra-op Plan:   Post-operative Plan:   Informed Consent: I have reviewed the patients History and Physical, chart, labs and discussed the procedure including the risks, benefits and alternatives for the proposed anesthesia with the patient or authorized representative who has indicated his/her understanding and acceptance.     Plan Discussed with: CRNA  Anesthesia Plan Comments:         Anesthesia Quick Evaluation  

## 2015-09-19 NOTE — Anesthesia Postprocedure Evaluation (Signed)
Anesthesia Post Note  Patient: Cheryl Mosley  Procedure(s) Performed: Procedure(s) (LRB): ESOPHAGOGASTRODUODENOSCOPY (EGD) WITH PROPOFOL (N/A)  Patient location during evaluation: PACU Anesthesia Type: MAC Level of consciousness: awake and alert and oriented Pain management: satisfactory to patient Vital Signs Assessment: post-procedure vital signs reviewed and stable Respiratory status: spontaneous breathing, nonlabored ventilation and respiratory function stable Cardiovascular status: blood pressure returned to baseline and stable Postop Assessment: Adequate PO intake and No signs of nausea or vomiting Anesthetic complications: no    Last Vitals:  Filed Vitals:   09/19/15 1200 09/19/15 1205  BP: 130/89   Pulse: 92 81  Temp:    Resp: 17 25    Last Pain: There were no vitals filed for this visit.               [name], Lamel Mccarley J

## 2015-09-19 NOTE — Transfer of Care (Signed)
Immediate Anesthesia Transfer of Care Note  Patient: Cheryl Mosley  Procedure(s) Performed: Procedure(s): ESOPHAGOGASTRODUODENOSCOPY (EGD) WITH PROPOFOL (N/A)  Patient Location: PACU  Anesthesia Type: MAC  Level of Consciousness: awake, alert  and patient cooperative  Airway and Oxygen Therapy: Patient Spontanous Breathing and Patient connected to supplemental oxygen  Post-op Assessment: Post-op Vital signs reviewed, Patient's Cardiovascular Status Stable, Respiratory Function Stable, Patent Airway and No signs of Nausea or vomiting  Post-op Vital Signs: Reviewed and stable  Complications: No apparent anesthesia complications

## 2015-09-19 NOTE — Anesthesia Procedure Notes (Signed)
Procedure Name: MAC Performed by: Randel Hargens Pre-anesthesia Checklist: Patient identified, Emergency Drugs available, Suction available, Patient being monitored and Timeout performed Patient Re-evaluated:Patient Re-evaluated prior to inductionOxygen Delivery Method: Nasal cannula       

## 2015-09-20 ENCOUNTER — Encounter: Payer: Self-pay | Admitting: Gastroenterology

## 2015-09-20 ENCOUNTER — Telehealth: Payer: Self-pay | Admitting: Gastroenterology

## 2015-09-20 NOTE — Telephone Encounter (Signed)
Returned pt's call regarding what Dr. Servando SnareWohl discussed with pt's husband yesterday after her procedure. Advised pt we really need to wait until her pathology returns to see if there is a reason for her gastritis. Dr. Servando SnareWohl will decide what treatment option will be best for her when this returns. Pt understood and will wait until its back.

## 2015-09-20 NOTE — Telephone Encounter (Signed)
Patient called and said her husband was told by Dr.Wohl she had inflammation and she is calling to get some info about that and he report. i explained to patient the report isn't back yet and Dr.Wohl will be in the office 11/23. I did ask patient was she taking anything for acid reflux because normally that's where the inflammation comes from and she said she was not. Please call patient

## 2015-09-21 ENCOUNTER — Encounter: Payer: Self-pay | Admitting: Gastroenterology

## 2015-09-21 ENCOUNTER — Other Ambulatory Visit: Payer: Self-pay

## 2015-09-21 DIAGNOSIS — B373 Candidiasis of vulva and vagina: Secondary | ICD-10-CM

## 2015-09-21 DIAGNOSIS — B3731 Acute candidiasis of vulva and vagina: Secondary | ICD-10-CM

## 2015-09-21 DIAGNOSIS — A048 Other specified bacterial intestinal infections: Secondary | ICD-10-CM

## 2015-09-21 MED ORDER — FLUCONAZOLE 150 MG PO TABS: 150.0000 mg | ORAL_TABLET | Freq: Every day | ORAL | Status: DC

## 2015-09-21 MED ORDER — AMOXICILLIN 500 MG PO TABS: 1000.0000 mg | ORAL_TABLET | Freq: Two times a day (BID) | ORAL | Status: DC

## 2015-09-21 MED ORDER — CLARITHROMYCIN 500 MG PO TABS: 500.0000 mg | ORAL_TABLET | Freq: Two times a day (BID) | ORAL | Status: DC

## 2015-09-26 ENCOUNTER — Emergency Department: Payer: BLUE CROSS/BLUE SHIELD

## 2015-09-26 ENCOUNTER — Encounter: Payer: Self-pay | Admitting: *Deleted

## 2015-09-26 ENCOUNTER — Emergency Department
Admission: EM | Admit: 2015-09-26 | Discharge: 2015-09-26 | Disposition: A | Discharge status: Home / Self Care | Payer: BLUE CROSS/BLUE SHIELD | Attending: Emergency Medicine | Admitting: Emergency Medicine

## 2015-09-26 ENCOUNTER — Telehealth: Payer: Self-pay | Admitting: Gastroenterology

## 2015-09-26 ENCOUNTER — Other Ambulatory Visit: Payer: Self-pay

## 2015-09-26 DIAGNOSIS — R0789 Other chest pain: Secondary | ICD-10-CM

## 2015-09-26 DIAGNOSIS — M549 Dorsalgia, unspecified: Secondary | ICD-10-CM | POA: Diagnosis not present

## 2015-09-26 DIAGNOSIS — R079 Chest pain, unspecified: Secondary | ICD-10-CM | POA: Insufficient documentation

## 2015-09-26 DIAGNOSIS — R1110 Vomiting, unspecified: Secondary | ICD-10-CM | POA: Diagnosis not present

## 2015-09-26 DIAGNOSIS — R1010 Upper abdominal pain, unspecified: Secondary | ICD-10-CM | POA: Diagnosis present

## 2015-09-26 DIAGNOSIS — I10 Essential (primary) hypertension: Secondary | ICD-10-CM | POA: Insufficient documentation

## 2015-09-26 DIAGNOSIS — K8050 Calculus of bile duct without cholangitis or cholecystitis without obstruction: Secondary | ICD-10-CM

## 2015-09-26 LAB — CBC WITH DIFFERENTIAL/PLATELET
BASOS ABS: 0 10*3/uL (ref 0–0.1)
BASOS PCT: 0 %
EOS ABS: 0 10*3/uL (ref 0–0.7)
EOS PCT: 0 %
HCT: 44 % (ref 35.0–47.0)
Hemoglobin: 14.6 g/dL (ref 12.0–16.0)
LYMPHS PCT: 10 %
Lymphs Abs: 0.7 10*3/uL — ABNORMAL LOW (ref 1.0–3.6)
MCH: 28 pg (ref 26.0–34.0)
MCHC: 33.1 g/dL (ref 32.0–36.0)
MCV: 84.7 fL (ref 80.0–100.0)
Monocytes Absolute: 0.4 10*3/uL (ref 0.2–0.9)
Monocytes Relative: 5 %
Neutro Abs: 6.4 10*3/uL (ref 1.4–6.5)
Neutrophils Relative %: 85 %
PLATELETS: 301 10*3/uL (ref 150–440)
RBC: 5.2 MIL/uL (ref 3.80–5.20)
RDW: 14.3 % (ref 11.5–14.5)
WBC: 7.5 10*3/uL (ref 3.6–11.0)

## 2015-09-26 LAB — COMPREHENSIVE METABOLIC PANEL
ALBUMIN: 4.3 g/dL (ref 3.5–5.0)
ALT: 26 U/L (ref 14–54)
AST: 22 U/L (ref 15–41)
Alkaline Phosphatase: 74 U/L (ref 38–126)
Anion gap: 9 (ref 5–15)
BUN: 11 mg/dL (ref 6–20)
CHLORIDE: 101 mmol/L (ref 101–111)
CO2: 28 mmol/L (ref 22–32)
CREATININE: 0.79 mg/dL (ref 0.44–1.00)
Calcium: 9.6 mg/dL (ref 8.9–10.3)
GFR calc Af Amer: >60 mL/min (ref >60)
GFR calc non Af Amer: >60 mL/min (ref >60)
Glucose, Bld: 129 mg/dL — ABNORMAL HIGH (ref 65–99)
Potassium: 3.2 mmol/L — ABNORMAL LOW (ref 3.5–5.1)
SODIUM: 138 mmol/L (ref 135–145)
Total Bilirubin: 1.1 mg/dL (ref 0.3–1.2)
Total Protein: 8.4 g/dL — ABNORMAL HIGH (ref 6.5–8.1)

## 2015-09-26 LAB — URINALYSIS COMPLETE WITH MICROSCOPIC (ARMC ONLY)
BACTERIA UA: NONE SEEN
BILIRUBIN URINE: NEGATIVE
Glucose, UA: NEGATIVE mg/dL
Ketones, ur: NEGATIVE mg/dL
LEUKOCYTES UA: NEGATIVE
NITRITE: NEGATIVE
PH: 7 (ref 5.0–8.0)
Protein, ur: NEGATIVE mg/dL
SPECIFIC GRAVITY, URINE: 1.015 (ref 1.005–1.030)

## 2015-09-26 LAB — LIPASE, BLOOD: Lipase: 20 U/L (ref 11–51)

## 2015-09-26 LAB — TROPONIN I: Troponin I: <0.03 ng/mL (ref <0.031)

## 2015-09-26 MED ORDER — IOHEXOL 350 MG/ML SOLN
125.0000 mL | Freq: Once | INTRAVENOUS | Status: AC | PRN
  Administered 2015-09-26: 125 mL via INTRAVENOUS

## 2015-09-26 MED ORDER — HYDROMORPHONE HCL 1 MG/ML IJ SOLN
1.0000 mg | Freq: Once | INTRAMUSCULAR | Status: AC
  Administered 2015-09-26: 1 mg via INTRAVENOUS
  Filled 2015-09-26: qty 1

## 2015-09-26 MED ORDER — ONDANSETRON HCL 4 MG PO TABS: 4.0000 mg | ORAL_TABLET | Freq: Every day | ORAL | Status: DC | PRN

## 2015-09-26 MED ORDER — SODIUM CHLORIDE 0.9 % IV SOLN
1000.0000 mL | Freq: Once | INTRAVENOUS | Status: AC
  Administered 2015-09-26: 1000 mL via INTRAVENOUS

## 2015-09-26 MED ORDER — OXYCODONE-ACETAMINOPHEN 7.5-325 MG PO TABS: 1.0000 | ORAL_TABLET | ORAL | Status: DC | PRN

## 2015-09-26 MED ORDER — ONDANSETRON HCL 4 MG/2ML IJ SOLN
4.0000 mg | Freq: Once | INTRAMUSCULAR | Status: AC
  Administered 2015-09-26: 4 mg via INTRAVENOUS
  Filled 2015-09-26: qty 2

## 2015-09-26 NOTE — Telephone Encounter (Signed)
Spoke with Cheryl Mosley and as advised she went [facility] ER this morning and was found to have acute cholecystitis. She was referred to our surgeons. Advised Cheryl Mosley she still needs to take the antibiotics given to treat her h pylori. She is scheduled with Dr. Tonita CongWoodham on [place]th in [place].

## 2015-09-26 NOTE — Telephone Encounter (Signed)
Patient called and wanted to give you an update

## 2015-09-26 NOTE — ED Provider Notes (Signed)
[facility] Emergency Department Provider Note     Time seen: ----------------------------------------- 7:26 AM on 09/26/2015 -----------------------------------------    I have reviewed the triage vital signs and the nursing notes.   HISTORY  Chief Complaint Abdominal Pain    HPI Cheryl Mosley is a 54 y.o. female who presents to ER with upper abdominal pain that radiates into her back on the left side. Patient has had a history of this, recently had endoscopy which showed H. pylori but no ulcers. Currently is on antibiotics and an acids and had vomiting last night. The makes the pain better or worse, pain is been there since August.   Past Medical History  Diagnosis Date  . Hypertension   . GERD (gastroesophageal reflux disease)   . Wears contact lenses     Patient Active Problem List   Diagnosis Date Noted  . Heartburn   . Other specified diseases of esophagus   . Gastritis   . Essential (primary) hypertension 06/16/2015  . Combined fat and carbohydrate induced hyperlipemia 06/16/2015  . 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 Helicobacter pylori gastritis 06/14/2015    Past Surgical History  Procedure Laterality Date  . Vaginal hysterectomy      partial  . Colonoscopy  2015    normal- cleared for 10 yrs- Dr Servando SnareWohl  . Esophagogastroduodenoscopy (egd) with propofol N/A 09/19/2015    Procedure: ESOPHAGOGASTRODUODENOSCOPY (EGD) WITH PROPOFOL;  Surgeon: Midge Miniumarren Wohl, MD;  [facility];  Service: Endoscopy;  Laterality: N/A;    Allergies Review of patient's allergies indicates no known allergies.  Social History Social History  Substance Use Topics  . Smoking status: Never Smoker   . Smokeless tobacco: Not on file  . Alcohol Use: No    Review of Systems Constitutional: Negative for fever. Eyes: Negative for visual changes. ENT: Negative for sore throat. Cardiovascular: Negative for chest  pain. Respiratory: Negative for shortness of breath. Gastrointestinal: Positive for abdominal pain and vomiting Genitourinary: Negative for dysuria. Musculoskeletal: Positive for back pain Skin: Negative for rash. Neurological: Negative for headaches, focal weakness or numbness.  10-point ROS otherwise negative.  ____________________________________________   PHYSICAL EXAM:  VITAL SIGNS: ED Triage Vitals  Enc Vitals Group     BP --      Pulse --      Resp --      Temp --      Temp src --      SpO2 --      Weight --      Height --      Head Cir --      Peak Flow --      Pain Score --      Pain Loc --      Pain Edu? --      Excl. in GC? --     Constitutional: Alert and oriented. Well appearing and in no distress. Patient is tearful Eyes: Conjunctivae are normal. PERRL. Normal extraocular movements. ENT   Head: Normocephalic and atraumatic.   Nose: No congestion/rhinnorhea.   Mouth/Throat: Mucous membranes are moist.   Neck: No stridor. Cardiovascular: Normal rate, regular rhythm. Normal and symmetric distal pulses are present in all extremities. No murmurs, rubs, or gallops. Respiratory: Normal respiratory effort without tachypnea nor retractions. Breath sounds are clear and equal bilaterally. No wheezes/rales/rhonchi. Gastrointestinal: Soft and nontender. No distention. No abdominal bruits.  Musculoskeletal: Nontender with normal range of motion in all extremities. No joint effusions.  No lower extremity  tenderness nor edema. Neurologic:  Normal speech and language. No gross focal neurologic deficits are appreciated. Speech is normal. No gait instability. Skin:  Skin is warm, dry and intact. No rash noted. Psychiatric: Mood and affect are normal. Speech and behavior are normal. Patient exhibits appropriate insight and judgment. ____________________________________________  ED COURSE:  Pertinent labs & imaging results that were available during my care of  the patient were reviewed by me and considered in my medical decision making (see chart for details). Patient's no distress, will check basic labs and reevaluate.  EKG: Interpreted by me, rate of 90 bpm, normal PR interval, normal QRS with, normal QT interval. LVH, PVCs. Normal sinus rhythm. ____________________________________________    LABS (pertinent positives/negatives)  Labs Reviewed  CBC WITH DIFFERENTIAL/PLATELET - Abnormal; Notable for the following:    Lymphs Abs 0.7 (*)    All other components within normal limits  COMPREHENSIVE METABOLIC PANEL - Abnormal; Notable for the following:    Potassium 3.2 (*)    Glucose, Bld 129 (*)    Total Protein 8.4 (*)    All other components within normal limits  URINALYSIS COMPLETEWITH MICROSCOPIC ([facility] ONLY) - Abnormal; Notable for the following:    Color, Urine STRAW (*)    APPearance CLEAR (*)    Hgb urine dipstick 1+ (*)    Squamous Epithelial / LPF 0-5 (*)    All other components within normal limits  LIPASE, BLOOD  TROPONIN I    RADIOLOGY Images were viewed by me  CT abdomen and pelvis IMPRESSION: 1. Pericholecystic edema concerning for cholecystitis, but no calcified gallstones. Consider right upper quadrant ultrasound to evaluate for cholelithiasis and Murphy sign. 2. Aortic atherosclerosis without aneurysm or dissection. 3. Neighboring or septated right adnexal cysts, up to 4 cm. Recommend nonemergent pelvic ultrasound. 4. Additional incidental findings described above.  IMPRESSION: Cholelithiasis with gallbladder wall edema and wall thickening. Although no sonographic Eulah Pont sign is identified to confirm acute cholecystitis, this remains a concern.  No biliary duct dilatation. ____________________________________________  FINAL ASSESSMENT AND PLAN  Chest pain, abdominal pain, biliary colic  Plan: Patient with labs and imaging as dictated above. Patient evidently with biliary colic although her pain is in  the opposite side. No other identifiable cause for her pain is evident. She'll be discharged with pain medicine, antiemetics and referred to have close follow-up with general surgery for cholecystectomy.   Emily Filbert, MD   Emily Filbert, MD 09/26/15 [phone]

## 2015-09-26 NOTE — Discharge Instructions (Signed)

## 2015-09-26 NOTE — ED Notes (Signed)
Patient transported to Ultrasound 

## 2015-10-04 ENCOUNTER — Ambulatory Visit: Payer: Self-pay | Admitting: Gastroenterology

## 2015-10-04 ENCOUNTER — Encounter: Payer: Self-pay | Admitting: Internal Medicine

## 2015-10-06 ENCOUNTER — Encounter: Payer: Self-pay | Admitting: General Surgery

## 2015-10-06 ENCOUNTER — Ambulatory Visit (INDEPENDENT_AMBULATORY_CARE_PROVIDER_SITE_OTHER): Payer: BLUE CROSS/BLUE SHIELD | Admitting: General Surgery

## 2015-10-06 VITALS — BP 142/93 | HR 84 | Temp 98.7°F | Ht 69.0 in | Wt 232.8 lb

## 2015-10-06 DIAGNOSIS — K8020 Calculus of gallbladder without cholecystitis without obstruction: Secondary | ICD-10-CM | POA: Insufficient documentation

## 2015-10-06 NOTE — Progress Notes (Signed)
Patient ID: Cheryl Mosley, female   DOB: 03/17/1961, 54 y.o.   MRN: [id number]  CC: ABDOMINAL PAIN  HPI Cheryl Mosley is a 54 y.o. female presents to clinic for evaluation of abdominal pain. She has had multiple attacks over the past few months. The pain occurs after eating each time. She has been seen in urgent care and the ER. She has had episodes of nausea and vomiting with the most recent attack. She is currently symptom free. She has had a complicated workup for her symptoms. She has had gastritis and been treated for H. Pylori without resolution of her symptoms. She was offered surgical consultation at her most recent visit to the ER and she declined, but is now interested in surgery as she does not wish to have another attack. She denies fever, chills, chest pain , shortness of breath or any changes in bowel habits.  HPI  Past Medical History  Diagnosis Date  . Hypertension   . GERD (gastroesophageal reflux disease)   . Wears contact lenses     Past Surgical History  Procedure Laterality Date  . Colonoscopy  2015    normal- cleared for 10 yrs- Dr Wohl  . Esophagogastroduodenoscopy (egd) with propofol N/A 09/19/2015    Procedure: ESOPHAGOGASTRODUODENOSCOPY (EGD) WITH PROPOFOL;  Surgeon: Darren Wohl, MD;  Location: [facility];  Service: Endoscopy;  Laterality: N/A;  . Vaginal hysterectomy  2005    partial    Family History  Problem Relation Age of Onset  . Hypertension Sister     Social History Social History  Substance Use Topics  . Smoking status: Never Smoker   . Smokeless tobacco: Never Used  . Alcohol Use: No    No Known Allergies  Current Outpatient Prescriptions  Medication Sig Dispense Refill  . amLODipine (NORVASC) 5 MG tablet Take 1 tablet (5 mg total) by mouth daily. 30 tablet 12  . amoxicillin (AMOXIL) 500 MG tablet Take 2 tablets (1,000 mg total) by mouth 2 (two) times daily. 56 tablet 0  . clarithromycin (BIAXIN) 500 MG tablet Take 1 tablet  (500 mg total) by mouth 2 (two) times daily. 28 tablet 0  . omeprazole (PRILOSEC) 40 MG capsule Take 1 capsule (40 mg total) by mouth daily. 30 capsule 3  . ondansetron (ZOFRAN) 4 MG tablet Take 1 tablet (4 mg total) by mouth daily as needed for nausea or vomiting. 20 tablet 1  . oxyCODONE-acetaminophen (PERCOCET) 7.5-325 MG tablet Take 1 tablet by mouth every 4 (four) hours as needed for severe pain. 20 tablet 0  . Probiotic Product (PHILLIPS COLON HEALTH) CAPS Take 1 capsule by mouth daily.    . valsartan-hydrochlorothiazide (DIOVAN-HCT) 320-25 MG per tablet Take 1 tablet by mouth daily. 30 tablet 5  . fluconazole (DIFLUCAN) 150 MG tablet Take 1 tablet (150 mg total) by mouth daily. Take 1 tablet now and 1 tablet 3 days later (Patient not taking: Reported on 10/06/2015) 2 tablet 0   No current facility-administered medications for this visit.     Review of Systems A multi-point review of systems was asked and was negative except for the positive findings documented in the HPI.  Physical Exam Blood pressure 142/93, pulse 84, temperature 98.7 F (37.1 C), temperature source Oral, height 5' 9" (1.753 m), weight 105.597 kg (232 lb 12.8 oz). CONSTITUTIONAL: no acute distress. EYES: Pupils are equal, round, and reactive to light, Sclera are non-icteric. EARS, NOSE, MOUTH AND THROAT: The oropharynx is clear. The oral mucosa is pink   and moist. Hearing is intact to voice. LYMPH NODES:  Lymph nodes in the neck are normal. RESPIRATORY:  Lungs are clear. There is normal respiratory effort, with equal breath sounds bilaterally, and without pathologic use of accessory muscles. CARDIOVASCULAR: Heart is regular without murmurs, gallops, or rubs. GI: The abdomen is benign, soft, nontender, and nondistended. There are no palpable masses. There is no hepatosplenomegaly. There are normal bowel sounds in all quadrants. GU: Rectal deferred.   MUSCULOSKELETAL: Normal muscle strength and tone. No cyanosis or  edema.   SKIN: Turgor is good and there are no pathologic skin lesions or ulcers. NEUROLOGIC: Motor and sensation is grossly normal. Cranial nerves are grossly intact. PSYCH:  Oriented to person, place and time. Affect is normal.  Data Reviewed Images and labs reviewed, Labs all within normal limits, CT with evidence of possible cholecystitis. I have personally reviewed the patient's imaging, laboratory findings and medical records.    Assessment    Biliary Colic     Plan    Biliary Colic: Discussed in detail the pathophysiology of gallstones and when they cause symptoms. She voiced interest in surgery for the 29th of December.  I discussed the procedure in detail.  The patient was given Agricultural engineereducational material.  We discussed the risks and benefits of a laparoscopic cholecystectomy and possible cholangiogram including, but not limited to bleeding, infection, injury to surrounding structures such as the intestine or liver, bile leak, retained gallstones, need to convert to an open procedure, prolonged diarrhea, blood clots such as  DVT, common bile duct injury, anesthesia risks, and possible need for additional procedures.  The likelihood of improvement in symptoms and return to the patient's normal status is good. We discussed the typical post-operative recovery course.      Time spent with the patient was 45 minutes, with more than 50% of the time spent in face-to-face education, counseling and care coordination.     Ricarda Frameharles Anav Lammert, MD FACS General Surgeon 10/06/2015, 7:36 PM

## 2015-10-06 NOTE — Patient Instructions (Signed)
You are requesting to have your gallbladder removed. We will arrange to do this on 12/29 at [facility] with Dr. Tonita CongWoodham. If you have another "episode" prior to this, call our office and we will have you seen.  You will need to arrange to be out of work 1 week and then you may return to work with a lifting restriction no more than 15 lbs. for approximately 4 weeks after surgery.

## 2015-10-12 ENCOUNTER — Encounter: Payer: Self-pay | Admitting: Internal Medicine

## 2015-10-12 ENCOUNTER — Telehealth: Payer: Self-pay | Admitting: General Surgery

## 2015-10-12 DIAGNOSIS — M26609 Unspecified temporomandibular joint disorder, unspecified side: Secondary | ICD-10-CM | POA: Insufficient documentation

## 2015-10-12 NOTE — Telephone Encounter (Signed)
Pt advised of pre op date/time and sx date. Sx: 10/27/15--Woodham--Laparoscopic cholecystectomy. Pre op: 10/20/15- between 9-1:00pm--Phone.

## 2015-10-20 ENCOUNTER — Other Ambulatory Visit: Payer: BLUE CROSS/BLUE SHIELD

## 2015-10-20 ENCOUNTER — Encounter: Payer: Self-pay | Admitting: *Deleted

## 2015-10-20 NOTE — Patient Instructions (Signed)
  Your procedure is scheduled on: 10-27-15 (THURSDAY) Report to [facility] SAME DAY SURGERY 2ND FLOOR To find out your arrival time please call [phone] between 1PM - 3PM on 10-26-15 [facility](WEDNESDAY)  Remember: Instructions that are not followed completely may result in serious medical risk, up to and including death, or upon the discretion of your surgeon and anesthesiologist your surgery may need to be rescheduled.    _X___ 1. Do not eat food or drink liquids after midnight. No gum chewing or hard candies.     _X___ 2. No Alcohol for 24 hours before or after surgery.   ____ 3. Bring all medications with you on the day of surgery if instructed.    _X___ 4. Notify your doctor if there is any change in your medical condition     (cold, fever, infections).     Do not wear jewelry, make-up, hairpins, clips or nail polish.  Do not wear lotions, powders, or perfumes. You may wear deodorant.  Do not shave 48 hours prior to surgery. Men may shave face and neck.  Do not bring valuables to the hospital.    [facility] is not responsible for any belongings or valuables.               Contacts, dentures or bridgework may not be worn into surgery.  Leave your suitcase in the car. After surgery it may be brought to your room.  For patients admitted to the hospital, discharge time is determined by your treatment team.   Patients discharged the day of surgery will not be allowed to drive home.   Please read over the following fact sheets that you were given:      _X___ Take these medicines the morning of surgery with A SIP OF WATER:    1. AMLODIPINE  2. PRILOSEC  3. TAKE AN EXTRA PRILOSEC ON Wednesday NIGHT  4.  5.  6.  ____ Fleet Enema (as directed)   __X__ Use CHG Soap as directed  ____ Use inhalers on the day of surgery  ____ Stop metformin 2 days prior to surgery    ____ Take 1/2 of usual insulin dose the night before surgery and none on the morning of surgery.   ____ Stop  Coumadin/Plavix/aspirin-N/A  ____ Stop Anti-inflammatories-NO NSAIDS OR ASPIRIN PRODUCTS-TYLENOL OR PERCOCET OK TO TAKE   _X___ Stop supplements until after surgery-STOP PROBIOTIC NOW   ____ Bring C-Pap to the hospital.

## 2015-10-25 ENCOUNTER — Encounter
Admission: RE | Admit: 2015-10-25 | Discharge: 2015-10-25 | Disposition: A | Discharge status: Home / Self Care | Payer: BLUE CROSS/BLUE SHIELD | Source: Ambulatory Visit | Attending: General Surgery | Admitting: General Surgery

## 2015-10-25 ENCOUNTER — Telehealth: Payer: Self-pay | Admitting: General Surgery

## 2015-10-25 DIAGNOSIS — K811 Chronic cholecystitis: Secondary | ICD-10-CM | POA: Diagnosis present

## 2015-10-25 DIAGNOSIS — Z8249 Family history of ischemic heart disease and other diseases of the circulatory system: Secondary | ICD-10-CM | POA: Diagnosis not present

## 2015-10-25 DIAGNOSIS — Z90710 Acquired absence of both cervix and uterus: Secondary | ICD-10-CM | POA: Diagnosis not present

## 2015-10-25 DIAGNOSIS — K219 Gastro-esophageal reflux disease without esophagitis: Secondary | ICD-10-CM | POA: Diagnosis not present

## 2015-10-25 DIAGNOSIS — K8010 Calculus of gallbladder with chronic cholecystitis without obstruction: Secondary | ICD-10-CM | POA: Diagnosis not present

## 2015-10-25 DIAGNOSIS — I10 Essential (primary) hypertension: Secondary | ICD-10-CM | POA: Diagnosis not present

## 2015-10-25 DIAGNOSIS — Z79899 Other long term (current) drug therapy: Secondary | ICD-10-CM | POA: Diagnosis not present

## 2015-10-25 LAB — POTASSIUM: POTASSIUM: 3.3 mmol/L — AB (ref 3.5–5.1)

## 2015-10-25 MED ORDER — POTASSIUM CHLORIDE CRYS ER 20 MEQ PO TBCR: 40.0000 meq | EXTENDED_RELEASE_TABLET | Freq: Two times a day (BID) | ORAL | Status: DC

## 2015-10-25 NOTE — Pre-Procedure Instructions (Signed)
Low potassium results called and faxed to Dr. Tonita CongWoodham office (spoke to TazewellMichelle).

## 2015-10-25 NOTE — Telephone Encounter (Signed)
K-Dur BID x 2 days sent to preferred pharmacy at this time. Please let patient know that this has been called in and she is to start this immediately. Her potassium will be rechecked the morning of surgery and if ok, we can proceed with Surgery as planned.  Called patient. No answer. Left voicemail for return phone call.  If patient calls back, please let her know of this and I will be glad to speak with her if she has further questions.

## 2015-10-25 NOTE — Telephone Encounter (Signed)
Gayle from pre admit called to report patients High potassium on her labs and asked that patient be put on a supplement prior to surgery, patient has gallbladder surgery 12/29 with Dr.Woodham. Inocencio HomesGayle number at hospital pre admit [phone]

## 2015-10-25 NOTE — Telephone Encounter (Signed)
Phoned patient back incase she called back after hours and left detailed message for patient and told her to get her medications and start ASAP.

## 2015-10-26 NOTE — Telephone Encounter (Signed)
Patient called back and confirmed message left on voicemail.

## 2015-10-27 ENCOUNTER — Ambulatory Visit: Payer: BLUE CROSS/BLUE SHIELD | Admitting: Certified Registered Nurse Anesthetist

## 2015-10-27 ENCOUNTER — Encounter: Payer: Self-pay | Admitting: *Deleted

## 2015-10-27 ENCOUNTER — Ambulatory Visit: Payer: BLUE CROSS/BLUE SHIELD

## 2015-10-27 ENCOUNTER — Encounter
Admission: RE | Disposition: A | Discharge status: Home / Self Care | Payer: Self-pay | Source: Ambulatory Visit | Attending: General Surgery

## 2015-10-27 ENCOUNTER — Ambulatory Visit
Admission: RE | Admit: 2015-10-27 | Discharge: 2015-10-27 | Disposition: A | Discharge status: Home / Self Care | Payer: BLUE CROSS/BLUE SHIELD | Source: Ambulatory Visit | Attending: General Surgery | Admitting: General Surgery

## 2015-10-27 DIAGNOSIS — K8010 Calculus of gallbladder with chronic cholecystitis without obstruction: Secondary | ICD-10-CM | POA: Diagnosis not present

## 2015-10-27 DIAGNOSIS — Z90710 Acquired absence of both cervix and uterus: Secondary | ICD-10-CM | POA: Insufficient documentation

## 2015-10-27 DIAGNOSIS — Z8249 Family history of ischemic heart disease and other diseases of the circulatory system: Secondary | ICD-10-CM | POA: Insufficient documentation

## 2015-10-27 DIAGNOSIS — K219 Gastro-esophageal reflux disease without esophagitis: Secondary | ICD-10-CM | POA: Insufficient documentation

## 2015-10-27 DIAGNOSIS — K8020 Calculus of gallbladder without cholecystitis without obstruction: Secondary | ICD-10-CM | POA: Insufficient documentation

## 2015-10-27 DIAGNOSIS — Z79899 Other long term (current) drug therapy: Secondary | ICD-10-CM | POA: Insufficient documentation

## 2015-10-27 DIAGNOSIS — I10 Essential (primary) hypertension: Secondary | ICD-10-CM | POA: Insufficient documentation

## 2015-10-27 HISTORY — PX: CHOLECYSTECTOMY: SHX55

## 2015-10-27 LAB — POCT I-STAT 4, (NA,K, GLUC, HGB,HCT)
GLUCOSE: 80 mg/dL (ref 65–99)
HEMATOCRIT: 41 % (ref 36.0–46.0)
HEMOGLOBIN: 13.9 g/dL (ref 12.0–15.0)
Potassium: 4.2 mmol/L (ref 3.5–5.1)
SODIUM: 140 mmol/L (ref 135–145)

## 2015-10-27 SURGERY — LAPAROSCOPIC CHOLECYSTECTOMY
Anesthesia: General

## 2015-10-27 MED ORDER — ONDANSETRON 4 MG PO TBDP: 4.0000 mg | ORAL_TABLET | Freq: Three times a day (TID) | ORAL | Status: DC | PRN

## 2015-10-27 MED ORDER — FENTANYL CITRATE (PF) 100 MCG/2ML IJ SOLN
INTRAMUSCULAR | Status: DC | PRN
  Administered 2015-10-27: 100 ug via INTRAVENOUS
  Administered 2015-10-27 (×2): 50 ug via INTRAVENOUS

## 2015-10-27 MED ORDER — OXYCODONE HCL 5 MG/5ML PO SOLN: 5.0000 mg | Freq: Once | ORAL | Status: DC | PRN

## 2015-10-27 MED ORDER — DEXAMETHASONE SODIUM PHOSPHATE 10 MG/ML IJ SOLN
INTRAMUSCULAR | Status: DC | PRN
  Administered 2015-10-27: 10 mg via INTRAVENOUS

## 2015-10-27 MED ORDER — LIDOCAINE HCL (PF) 1 % IJ SOLN
INTRAMUSCULAR | Status: AC
  Filled 2015-10-27: qty 30

## 2015-10-27 MED ORDER — ROCURONIUM BROMIDE 100 MG/10ML IV SOLN
INTRAVENOUS | Status: DC | PRN
  Administered 2015-10-27 (×2): 5 mg via INTRAVENOUS
  Administered 2015-10-27: 10 mg via INTRAVENOUS
  Administered 2015-10-27: 30 mg via INTRAVENOUS

## 2015-10-27 MED ORDER — SUGAMMADEX SODIUM 500 MG/5ML IV SOLN
INTRAVENOUS | Status: DC | PRN
  Administered 2015-10-27: 210.4 mg via INTRAVENOUS

## 2015-10-27 MED ORDER — BUPIVACAINE HCL 0.5 % IJ SOLN
INTRAMUSCULAR | Status: DC | PRN
  Administered 2015-10-27: 10 mL

## 2015-10-27 MED ORDER — ACETAMINOPHEN 10 MG/ML IV SOLN
INTRAVENOUS | Status: AC
  Filled 2015-10-27: qty 100

## 2015-10-27 MED ORDER — BUPIVACAINE HCL (PF) 0.5 % IJ SOLN
INTRAMUSCULAR | Status: AC
  Filled 2015-10-27: qty 30

## 2015-10-27 MED ORDER — MIDAZOLAM HCL 2 MG/2ML IJ SOLN
INTRAMUSCULAR | Status: DC | PRN
  Administered 2015-10-27: 2 mg via INTRAVENOUS

## 2015-10-27 MED ORDER — FENTANYL CITRATE (PF) 100 MCG/2ML IJ SOLN: 25.0000 ug | INTRAMUSCULAR | Status: DC | PRN

## 2015-10-27 MED ORDER — PROPOFOL 10 MG/ML IV BOLUS
INTRAVENOUS | Status: DC | PRN
  Administered 2015-10-27: 180 mg via INTRAVENOUS
  Administered 2015-10-27: 40 mg via INTRAVENOUS

## 2015-10-27 MED ORDER — OXYCODONE-ACETAMINOPHEN 7.5-325 MG PO TABS: 1.0000 | ORAL_TABLET | ORAL | Status: DC | PRN

## 2015-10-27 MED ORDER — LIDOCAINE HCL (PF) 1 % IJ SOLN
INTRAMUSCULAR | Status: DC | PRN
  Administered 2015-10-27: 10 mL

## 2015-10-27 MED ORDER — LIDOCAINE HCL (CARDIAC) 20 MG/ML IV SOLN
INTRAVENOUS | Status: DC | PRN
  Administered 2015-10-27: 80 mg via INTRAVENOUS

## 2015-10-27 MED ORDER — OXYCODONE HCL 5 MG PO TABS: 5.0000 mg | ORAL_TABLET | Freq: Once | ORAL | Status: DC | PRN

## 2015-10-27 MED ORDER — CEFOXITIN SODIUM-DEXTROSE 2-2.2 GM-% IV SOLR (PREMIX)
INTRAVENOUS | Status: AC
  Administered 2015-10-27: 2000 mg via INTRAVENOUS
  Filled 2015-10-27: qty 50

## 2015-10-27 MED ORDER — DOCUSATE SODIUM 100 MG PO CAPS: 100.0000 mg | ORAL_CAPSULE | Freq: Two times a day (BID) | ORAL | Status: DC

## 2015-10-27 MED ORDER — ACETAMINOPHEN 10 MG/ML IV SOLN
INTRAVENOUS | Status: DC | PRN
Start: 2015-10-27 — End: 2015-10-27
  Administered 2015-10-27: 1000 mg via INTRAVENOUS

## 2015-10-27 MED ORDER — LACTATED RINGERS IV SOLN
INTRAVENOUS | Status: DC
  Administered 2015-10-27 (×2): via INTRAVENOUS

## 2015-10-27 MED ORDER — CHLORHEXIDINE GLUCONATE 4 % EX LIQD: 1.0000 "application " | Freq: Once | CUTANEOUS | Status: DC

## 2015-10-27 MED ORDER — ONDANSETRON HCL 4 MG/2ML IJ SOLN
INTRAMUSCULAR | Status: DC | PRN
  Administered 2015-10-27: 4 mg via INTRAVENOUS

## 2015-10-27 MED ORDER — CEFOXITIN SODIUM-DEXTROSE 2-2.2 GM-% IV SOLR (PREMIX)
2.0000 g | INTRAVENOUS | Status: AC
  Administered 2015-10-27: 2000 mg via INTRAVENOUS

## 2015-10-27 MED ORDER — KETOROLAC TROMETHAMINE 30 MG/ML IJ SOLN
INTRAMUSCULAR | Status: DC | PRN
  Administered 2015-10-27: 30 mg via INTRAVENOUS

## 2015-10-27 MED ORDER — FAMOTIDINE 20 MG PO TABS
ORAL_TABLET | ORAL | Status: AC
  Filled 2015-10-27: qty 1

## 2015-10-27 MED ORDER — CHLORHEXIDINE GLUCONATE 4 % EX LIQD
1.0000 "application " | Freq: Once | CUTANEOUS | Status: AC
  Administered 2015-10-27: 1 via TOPICAL

## 2015-10-27 MED ORDER — IOTHALAMATE MEGLUMINE 60 % INJ SOLN
INTRAMUSCULAR | Status: DC | PRN
  Administered 2015-10-27: 70 mL

## 2015-10-27 MED ORDER — SUCCINYLCHOLINE CHLORIDE 20 MG/ML IJ SOLN
INTRAMUSCULAR | Status: DC | PRN
  Administered 2015-10-27: 100 mg via INTRAVENOUS
  Administered 2015-10-27: 20 mg via INTRAVENOUS

## 2015-10-27 MED ORDER — SODIUM CHLORIDE 0.9 % IR SOLN
Status: DC | PRN
  Administered 2015-10-27: 1600 mL

## 2015-10-27 SURGICAL SUPPLY — 49 items
ADHESIVE MASTISOL STRL (MISCELLANEOUS) IMPLANT
APPLIER CLIP ROT 10 11.4 M/L (STAPLE) ×3
BAG COUNTER SPONGE EZ (MISCELLANEOUS) IMPLANT
BLADE SURG SZ11 CARB STEEL (BLADE) ×3 IMPLANT
BULB RESERV EVAC DRAIN JP 100C (MISCELLANEOUS) IMPLANT
CANISTER SUCT 1200ML W/VALVE (MISCELLANEOUS) ×6 IMPLANT
CATH CHOLANG 76X19 KUMAR (CATHETERS) ×3 IMPLANT
CHLORAPREP W/TINT 26ML (MISCELLANEOUS) ×3 IMPLANT
CLIP APPLIE ROT 10 11.4 M/L (STAPLE) ×1 IMPLANT
CLOSURE WOUND 1/2 X4 (GAUZE/BANDAGES/DRESSINGS)
CONRAY 60ML FOR OR (MISCELLANEOUS) ×6 IMPLANT
COUNTER SPONGE BAG EZ (MISCELLANEOUS)
DISSECTOR KITTNER STICK (MISCELLANEOUS) ×1 IMPLANT
DISSECTORS/KITTNER STICK (MISCELLANEOUS) ×3
DRAIN CHANNEL JP 19F (MISCELLANEOUS) IMPLANT
DRAPE SHEET LG 3/4 BI-LAMINATE (DRAPES) ×3 IMPLANT
DRSG TEGADERM 2-3/8X2-3/4 SM (GAUZE/BANDAGES/DRESSINGS) IMPLANT
DRSG TELFA 3X8 NADH (GAUZE/BANDAGES/DRESSINGS) IMPLANT
ELECT E-Z MONOPOLAR 33 (MISCELLANEOUS) ×3
ELECTRODE E-Z MONOPOLAR 33 (MISCELLANEOUS) ×1 IMPLANT
GLOVE BIO SURGEON STRL SZ7.5 (GLOVE) ×9 IMPLANT
GLOVE INDICATOR 8.0 STRL GRN (GLOVE) ×9 IMPLANT
GOWN STRL REUS W/ TWL LRG LVL3 (GOWN DISPOSABLE) ×3 IMPLANT
GOWN STRL REUS W/TWL LRG LVL3 (GOWN DISPOSABLE) ×6
IRRIGATION STRYKERFLOW (MISCELLANEOUS) IMPLANT
IRRIGATOR STRYKERFLOW (MISCELLANEOUS)
IV NS 1000ML (IV SOLUTION) ×4
IV NS 1000ML BAXH (IV SOLUTION) ×2 IMPLANT
L-HOOK LAP DISP 36CM (ELECTROSURGICAL)
LABEL OR SOLS (LABEL) ×3 IMPLANT
LHOOK LAP DISP 36CM (ELECTROSURGICAL) IMPLANT
LIQUID BAND (GAUZE/BANDAGES/DRESSINGS) IMPLANT
NEEDLE HYPO 25X1 1.5 SAFETY (NEEDLE) ×3 IMPLANT
NEEDLE VERESS 14GA 120MM (NEEDLE) ×3 IMPLANT
NS IRRIG 500ML POUR BTL (IV SOLUTION) ×3 IMPLANT
PACK LAP CHOLECYSTECTOMY (MISCELLANEOUS) ×3 IMPLANT
PAD GROUND ADULT SPLIT (MISCELLANEOUS) ×3 IMPLANT
PENCIL ELECTRO HAND CTR (MISCELLANEOUS) ×3 IMPLANT
POUCH ENDO CATCH 10MM SPEC (MISCELLANEOUS) ×3 IMPLANT
SCISSORS METZENBAUM CVD 33 (INSTRUMENTS) ×3 IMPLANT
SLEEVE ENDOPATH XCEL 5M (ENDOMECHANICALS) ×6 IMPLANT
STRAP SAFETY BODY (MISCELLANEOUS) ×3 IMPLANT
STRIP CLOSURE SKIN 1/2X4 (GAUZE/BANDAGES/DRESSINGS) IMPLANT
SUT MNCRL 4-0 (SUTURE) ×2
SUT MNCRL 4-0 27XMFL (SUTURE) ×1
SUTURE MNCRL 4-0 27XMF (SUTURE) ×1 IMPLANT
TROCAR XCEL 12X100 BLDLESS (ENDOMECHANICALS) ×3 IMPLANT
TROCAR XCEL NON-BLD 5MMX100MML (ENDOMECHANICALS) ×3 IMPLANT
TUBING INSUFFLATOR HI FLOW (MISCELLANEOUS) ×3 IMPLANT

## 2015-10-27 NOTE — Anesthesia Preprocedure Evaluation (Signed)
Anesthesia Evaluation  Patient identified by MRN, date of birth, ID band Patient awake    Reviewed: Allergy & Precautions, H&P , NPO status , Patient's Chart, lab work & pertinent test results  Airway Mallampati: III  TM Distance: >3 FB Neck ROM: full    Dental  (+) Poor Dentition, Chipped   Pulmonary neg pulmonary ROS, neg shortness of breath,    Pulmonary exam normal breath sounds clear to auscultation       Cardiovascular Exercise Tolerance: Good hypertension, (-) angina(-) Past MI Normal cardiovascular exam Rhythm:regular Rate:Normal     Neuro/Psych negative neurological ROS  negative psych ROS   GI/Hepatic Neg liver ROS, GERD  Controlled,  Endo/Other  negative endocrine ROS  Renal/GU negative Renal ROS  negative genitourinary   Musculoskeletal   Abdominal   Peds  Hematology negative hematology ROS (+)   Anesthesia Other Findings Past Medical History:   Hypertension                                                 GERD (gastroesophageal reflux disease)                       Wears contact lenses                                        Past Surgical History:   COLONOSCOPY                                      2015           Comment:normal- cleared for 10 yrs- Dr Servando SnareWohl   ESOPHAGOGASTRODUODENOSCOPY (EGD) WITH PROPOFOL  N/A 09/19/2015     Comment:Procedure: ESOPHAGOGASTRODUODENOSCOPY (EGD)               WITH PROPOFOL;  Surgeon: Midge Miniumarren Wohl, MD;                [facility];  Service:               Endoscopy;  Laterality: N/A;   VAGINAL HYSTERECTOMY                             2005           Comment:partial  BMI    Body Mass Index   34.24 kg/m 2      Reproductive/Obstetrics negative OB ROS                             Anesthesia Physical Anesthesia Plan  ASA: III  Anesthesia Plan: General ETT   Post-op Pain Management:    Induction:   Airway Management  Planned:   Additional Equipment:   Intra-op Plan:   Post-operative Plan:   Informed Consent: I have reviewed the patients History and Physical, chart, labs and discussed the procedure including the risks, benefits and alternatives for the proposed anesthesia with the patient or authorized representative who has indicated his/her understanding and acceptance.   Dental Advisory Given  Plan Discussed with: Anesthesiologist, CRNA and Surgeon  Anesthesia Plan  Comments:         Anesthesia Quick Evaluation

## 2015-10-27 NOTE — Anesthesia Postprocedure Evaluation (Signed)
Anesthesia Post Note  Patient: Cheryl Mosley  Procedure(s) Performed: Procedure(s) (LRB): LAPAROSCOPIC CHOLECYSTECTOMY WITH INTRAOPERATIVE CHOLANGIOGRAM (N/A)  Patient location during evaluation: PACU Anesthesia Type: General Level of consciousness: awake and alert Pain management: pain level controlled Vital Signs Assessment: post-procedure vital signs reviewed and stable Respiratory status: spontaneous breathing, nonlabored ventilation, respiratory function stable and patient connected to nasal cannula oxygen Cardiovascular status: blood pressure returned to baseline and stable Postop Assessment: no signs of nausea or vomiting Anesthetic complications: no    Last Vitals:  Filed Vitals:   10/27/15 1228 10/27/15 1258  BP: 127/85 137/93  Pulse: 93 91  Temp: 36.1 C   Resp: 16 16    Last Pain:  Filed Vitals:   10/27/15 1300  PainSc: 0-No pain                 Jomarie LongsJoseph K Deniro Laymon

## 2015-10-27 NOTE — Interval H&P Note (Signed)
History and Physical Interval Note:  10/27/2015 8:29 AM  Cheryl Mosley  has presented today for surgery, with the diagnosis of CHOLELITHIASIS WITHOUT CHOLECYSTITIS  The various methods of treatment have been discussed with the patient and family. After consideration of risks, benefits and other options for treatment, the patient has consented to  Procedure(s): LAPAROSCOPIC CHOLECYSTECTOMY (N/A) as a surgical intervention .  The patient's history has been reviewed, patient examined, no change in status, stable for surgery.  I have reviewed the patient's chart and labs.  Questions were answered to the patient's satisfaction.     Ricarda Frameharles Ardis Fullwood

## 2015-10-27 NOTE — Op Note (Signed)
Laparoscopic Cholecystectomy  Pre-operative Diagnosis: Biliary colic  Post-operative Diagnosis: Chronic cholecystitis  Procedure: Laparoscopic cholecystectomy with cholangiogram  Surgeon: Leonette Most T. Tonita Cong, MD FACS  Anesthesia: Gen. with endotracheal tube  Assistant: None  Procedure Details  The patient was seen again in the Holding Room. The benefits, complications, treatment options, and expected outcomes were discussed with the patient. The risks of bleeding, infection, recurrence of symptoms, failure to resolve symptoms, bile duct damage, bile duct leak, retained common bile duct stone, bowel injury, any of which could require further surgery and/or ERCP, stent, or papillotomy were reviewed with the patient. The likelihood of improving the patient's symptoms with return to their baseline status is good.  The patient and/or family concurred with the proposed plan, giving informed consent.  The patient was taken to Operating Room, identified as Cheryl Mosley and the procedure verified as Laparoscopic Cholecystectomy.  A Time Out was held and the above information confirmed.  Prior to the induction of general anesthesia, antibiotic prophylaxis was administered. VTE prophylaxis was in place. General endotracheal anesthesia was then administered and tolerated well. After the induction, the abdomen was prepped with Chloraprep and draped in the sterile fashion. The patient was positioned in the supine position.  Local anesthetic  was injected into the skin near the umbilicus and an incision made. The Veress needle was placed. Pneumoperitoneum was then created with CO2 and tolerated well without any adverse changes in the patient's vital signs. A 5mm port was placed in the periumbilical position and the abdominal cavity was explored.  Two 5-mm ports were placed in the right upper quadrant and a 12 mm epigastric port was placed all under direct vision. All skin incisions  were infiltrated with a  local anesthetic agent before making the incision and placing the trocars.   The patient was positioned  in reverse Trendelenburg, tilted slightly to the patient's left. The patient's omentum was noted to be adhesed to her anterior liver. This was taken down using accommodation of blunt dissection and hook electrocautery. After this the gallbladder was identified, the fundus grasped and retracted cephalad. The entire gallbladder was encased in a thick inflammatory rind. Adhesions were lysed bluntly. The infundibulum was grasped and retracted laterally, exposing the peritoneum overlying the triangle of Calot. This was then divided and exposed in a blunt fashion. A critical view of the cystic duct and cystic artery was obtained.  The cystic duct was clearly identified and bluntly dissected. An anterior cystic artery was identified and serially clipped and cut with endoclips and shears.  The structure that was identified as the cystic duct was noted to be extremely thick. Due to this unusual finding the decision was made to perform a cholangiogram. Using a Kumar catheter and Kumar clamp access to this duct structure was obtained and a cholangiogram was shot. The first cholangiogram only showed backfilling of the gallbladder with no for progression of contrast. The catheter was repositioned and the duct was again manipulated and a second attempt was obtained which showed spillage of contrast and the perineal cavity. The gallbladder was repositioned and the entire right upper quadrant was copiously irrigated and attempts to remove the spilled contrast. The duct was manipulated again and what appeared to be a stone stuck in the cystic duct was milked back into the gallbladder. An additional attempt at closure gram was then obtained which showed filling of the common and hepatic ducts. With confirmed anatomy, the cystic duct and a posterior cystic artery were serially clipped with laparoscopic  endoclips and cut with  EndoShears.  The gallbladder was taken from the gallbladder fossa in a retrograde fashion with the electrocautery. The gallbladder was removed and placed in an Endocatch bag. The liver bed was irrigated and inspected. Hemostasis was achieved with the electrocautery. Copious irrigation was utilized and was repeatedly aspirated until clear.  The gallbladder and Endocatch sac were then removed through the epigastric port site. The port site had to be bluntly enlarged with a Kelly forceps to allow for removal of the gallbladder.  Inspection of the right upper quadrant was performed. No bleeding, bile duct injury or leak, or bowel injury was noted. Pneumoperitoneum was released.   4-0 subcuticular Monocryl was used to close the skin. Steristrips and Mastisol and sterile dressings were  applied.  The patient was then extubated and brought to the recovery room in stable condition. Sponge, lap, and needle counts were correct at closure and at the conclusion of the case.   Findings: Chronic Cholecystitis   Estimated Blood Loss: 20 mL's         Drains: None         Specimens: Gallbladder           Complications: none               Cheryl Flynn T. Tonita CongWoodham, MD, FACS

## 2015-10-27 NOTE — H&P (View-Only) (Signed)
Patient ID: Cheryl Mosley, female   DOB: 08-Nov-1960, 54 y.o.   MRN: [id number]  CC: ABDOMINAL PAIN  HPI Cheryl Mosley is a 54 y.o. female presents to clinic for evaluation of abdominal pain. She has had multiple attacks over the past few months. The pain occurs after eating each time. She has been seen in urgent care and the ER. She has had episodes of nausea and vomiting with the most recent attack. She is currently symptom free. She has had a complicated workup for her symptoms. She has had gastritis and been treated for H. Pylori without resolution of her symptoms. She was offered surgical consultation at her most recent visit to the ER and she declined, but is now interested in surgery as she does not wish to have another attack. She denies fever, chills, chest pain , shortness of breath or any changes in bowel habits.  HPI  Past Medical History  Diagnosis Date  . Hypertension   . GERD (gastroesophageal reflux disease)   . Wears contact lenses     Past Surgical History  Procedure Laterality Date  . Colonoscopy  2015    normal- cleared for 10 yrs- Dr Servando Snare  . Esophagogastroduodenoscopy (egd) with propofol N/A 09/19/2015    Procedure: ESOPHAGOGASTRODUODENOSCOPY (EGD) WITH PROPOFOL;  Surgeon: Midge Minium, MD;  Location: [facility];  Service: Endoscopy;  Laterality: N/A;  . Vaginal hysterectomy  2005    partial    Family History  Problem Relation Age of Onset  . Hypertension Sister     Social History Social History  Substance Use Topics  . Smoking status: Never Smoker   . Smokeless tobacco: Never Used  . Alcohol Use: No    No Known Allergies  Current Outpatient Prescriptions  Medication Sig Dispense Refill  . amLODipine (NORVASC) 5 MG tablet Take 1 tablet (5 mg total) by mouth daily. 30 tablet 12  . amoxicillin (AMOXIL) 500 MG tablet Take 2 tablets (1,000 mg total) by mouth 2 (two) times daily. 56 tablet 0  . clarithromycin (BIAXIN) 500 MG tablet Take 1 tablet  (500 mg total) by mouth 2 (two) times daily. 28 tablet 0  . omeprazole (PRILOSEC) 40 MG capsule Take 1 capsule (40 mg total) by mouth daily. 30 capsule 3  . ondansetron (ZOFRAN) 4 MG tablet Take 1 tablet (4 mg total) by mouth daily as needed for nausea or vomiting. 20 tablet 1  . oxyCODONE-acetaminophen (PERCOCET) 7.5-325 MG tablet Take 1 tablet by mouth every 4 (four) hours as needed for severe pain. 20 tablet 0  . Probiotic Product (PHILLIPS COLON HEALTH) CAPS Take 1 capsule by mouth daily.    . valsartan-hydrochlorothiazide (DIOVAN-HCT) 320-25 MG per tablet Take 1 tablet by mouth daily. 30 tablet 5  . fluconazole (DIFLUCAN) 150 MG tablet Take 1 tablet (150 mg total) by mouth daily. Take 1 tablet now and 1 tablet 3 days later (Patient not taking: Reported on 10/06/2015) 2 tablet 0   No current facility-administered medications for this visit.     Review of Systems A multi-point review of systems was asked and was negative except for the positive findings documented in the HPI.  Physical Exam Blood pressure 142/93, pulse 84, temperature 98.7 F (37.1 C), temperature source Oral, height  (1.753 m), weight 105.597 kg (232 lb 12.8 oz). CONSTITUTIONAL: no acute distress. EYES: Pupils are equal, round, and reactive to light, Sclera are non-icteric. EARS, NOSE, MOUTH AND THROAT: The oropharynx is clear. The oral mucosa is pink  and moist. Hearing is intact to voice. LYMPH NODES:  Lymph nodes in the neck are normal. RESPIRATORY:  Lungs are clear. There is normal respiratory effort, with equal breath sounds bilaterally, and without pathologic use of accessory muscles. CARDIOVASCULAR: Heart is regular without murmurs, gallops, or rubs. GI: The abdomen is benign, soft, nontender, and nondistended. There are no palpable masses. There is no hepatosplenomegaly. There are normal bowel sounds in all quadrants. GU: Rectal deferred.   MUSCULOSKELETAL: Normal muscle strength and tone. No cyanosis or  edema.   SKIN: Turgor is good and there are no pathologic skin lesions or ulcers. NEUROLOGIC: Motor and sensation is grossly normal. Cranial nerves are grossly intact. PSYCH:  Oriented to person, place and time. Affect is normal.  Data Reviewed Images and labs reviewed, Labs all within normal limits, CT with evidence of possible cholecystitis. I have personally reviewed the patient's imaging, laboratory findings and medical records.    Assessment    Biliary Colic     Plan    Biliary Colic: Discussed in detail the pathophysiology of gallstones and when they cause symptoms. She voiced interest in surgery for the 29th of December.  I discussed the procedure in detail.  The patient was given Agricultural engineereducational material.  We discussed the risks and benefits of a laparoscopic cholecystectomy and possible cholangiogram including, but not limited to bleeding, infection, injury to surrounding structures such as the intestine or liver, bile leak, retained gallstones, need to convert to an open procedure, prolonged diarrhea, blood clots such as  DVT, common bile duct injury, anesthesia risks, and possible need for additional procedures.  The likelihood of improvement in symptoms and return to the patient's normal status is good. We discussed the typical post-operative recovery course.      Time spent with the patient was 45 minutes, with more than 50% of the time spent in face-to-face education, counseling and care coordination.     Cheryl Frameharles Natara Monfort, MD FACS General Surgeon 10/06/2015, 7:36 PM

## 2015-10-27 NOTE — Brief Op Note (Signed)
10/27/2015  10:53 AM  PATIENT:  Caprice RenshawNorene K Sherley  54 y.o. female  PRE-OPERATIVE DIAGNOSIS:  biliary colic  POST-OPERATIVE DIAGNOSIS:  chronic cholecystitis  PROCEDURE:  Procedure(s): LAPAROSCOPIC CHOLECYSTECTOMY WITH INTRAOPERATIVE CHOLANGIOGRAM (N/A)  SURGEON:  Surgeon(s) and Role:    * Ricarda Frameharles Banyan Goodchild, MD - Primary  PHYSICIAN ASSISTANT:   ASSISTANTS: none   ANESTHESIA:   general  EBL:  Total I/O In: 1000 [I.V.:1000] Out: -   BLOOD ADMINISTERED:none  DRAINS: none   LOCAL MEDICATIONS USED:  MARCAINE   , XYLOCAINE  and Amount: 20 ml  SPECIMEN:  Source of Specimen:  gallbladder  DISPOSITION OF SPECIMEN:  PATHOLOGY  COUNTS:  YES  TOURNIQUET:  * No tourniquets in log *  DICTATION: .Dragon Dictation  PLAN OF CARE: Discharge to home after PACU  PATIENT DISPOSITION:  PACU - hemodynamically stable.   Delay start of Pharmacological VTE agent (>24hrs) due to surgical blood loss or risk of bleeding: not applicable

## 2015-10-27 NOTE — Transfer of Care (Signed)
Immediate Anesthesia Transfer of Care Note  Patient: Cheryl Mosley  Procedure(s) Performed: Procedure(s): LAPAROSCOPIC CHOLECYSTECTOMY WITH INTRAOPERATIVE CHOLANGIOGRAM (N/A)  Patient Location: PACU  Anesthesia Type:General  Level of Consciousness: sedated  Airway & Oxygen Therapy: Patient Spontanous Breathing and Patient connected to face mask oxygen  Post-op Assessment: Report given to RN and Post -op Vital signs reviewed and stable  Post vital signs: Reviewed and stable  Last Vitals:  Filed Vitals:   10/27/15 0807  BP: 145/100  Pulse: 76  Temp: 36.7 C  Resp: 16    Complications: No anesthetic complications

## 2015-10-27 NOTE — Discharge Instructions (Signed)
Laparoscopic Cholecystectomy, Care After Refer to this sheet in the next few weeks. These instructions provide you with information about caring for yourself after your procedure. Your health care provider may also give you more specific instructions. Your treatment has been planned according to current medical practices, but problems sometimes occur. Call your health care provider if you have any problems or questions after your procedure. WHAT TO EXPECT AFTER THE PROCEDURE After your procedure, it is common to have:  Pain at your incision sites. You will be given pain medicines to control your pain.  Mild nausea or vomiting. This should improve after the first 24 hours.  Bloating and possible shoulder pain from the gas that was used during the procedure. This will improve after the first 24 hours. HOME CARE INSTRUCTIONS Incision Care  Follow instructions from your health care provider about how to take care of your incisions. Make sure you:  Wash your hands with soap and water before you change your bandage (dressing). If soap and water are not available, use hand sanitizer.  Change your dressing as told by your health care provider.  Leave stitches (sutures), skin glue, or adhesive strips in place. These skin closures may need to be in place for 2 weeks or longer. If adhesive strip edges start to loosen and curl up, you may trim the loose edges. Do not remove adhesive strips completely unless your health care provider tells you to do that.  Do not take baths, swim, or use a hot tub until your health care provider approves. Ask your health care provider if you can take showers. You may only be allowed to take sponge baths for bathing. General Instructions  Take over-the-counter and prescription medicines only as told by your health care provider.  Do not drive or operate heavy machinery while taking prescription pain medicine.  Return to your normal diet as told by your health care  provider.  Do not lift anything that is heavier than 10 lb (4.5 kg).  Do not play contact sports for one week or until your health care provider approves. SEEK MEDICAL CARE IF:   You have redness, swelling, or pain at the site of your incision.  You have fluid, blood, or pus coming from your incision.  You notice a bad smell coming from your incision area.  Your surgical incisions break open.  You have a fever. SEEK IMMEDIATE MEDICAL CARE IF:  You develop a rash.  You have difficulty breathing.  You have chest pain.  You have increasing pain in your shoulders (shoulder strap areas).  You faint or have dizzy episodes while you are standing.  You have severe pain in your abdomen.  You have nausea or vomiting that lasts for more than one day.   This information is not intended to replace advice given to you by your health care provider. Make sure you discuss any questions you have with your health care provider.   Document Released: 10/15/2005 Document Revised: 07/06/2015 Document Reviewed: 05/27/2013       AMBULATORY SURGERY  DISCHARGE INSTRUCTIONS   1) The drugs that you were given will stay in your system until tomorrow so for the next 24 hours you should not:  A) Drive an automobile B) Make any legal decisions C) Drink any alcoholic beverage   2) You may resume regular meals tomorrow.  Today it is better to start with liquids and gradually work up to solid foods.  You may eat anything you prefer, but it is  better to start with liquids, then soup and crackers, and gradually work up to solid foods.   3) Please notify your doctor immediately if you have any unusual bleeding, trouble breathing, redness and pain at the surgery site, drainage, fever, or pain not relieved by medication. 4)   5) Your post-operative visit with Dr.                                     is: Date:                        Time:    Please call to schedule your post-operative  visit.  6) Additional Instructions: Risk analyst Patient Education [employer]

## 2015-10-28 LAB — SURGICAL PATHOLOGY

## 2015-11-01 ENCOUNTER — Other Ambulatory Visit: Payer: Self-pay

## 2015-11-01 ENCOUNTER — Telehealth: Payer: Self-pay

## 2015-11-01 NOTE — Telephone Encounter (Signed)
Post discharge call to patient made at this time. Pain is controlled at this time. Pt has had some dizziness, bloating, and slight nausea today. Denies fever, vomiting, and constipation. Goes on to further say that she has had 1 bowel movement since surgery. I explained to patient that she could still need to move her bowels to get this bloating feeling gone. Encouraged use of Miralax 17g x 2 doses 6 hours apart today followed by Dulcolax 5mg  tablet x 2 doses. If she is still having these symptoms or does not have success with a bowel movement after this, to please call and speak with a nurse. Also, encouraged use of Ibuprofen to help with pain as patient is back to work and is not able to take Narcotic pain medication. Informed her that we could give her a work note for half days or for the week if she needed to go home as she is only 5 days out from surgery.   Asked patient to call back immediately if she develops a fever, abdominal pain, or nausea/vomiting becomes worse.  Confirmed patient appointment scheduled. Encouraged patient to call with any questions that arise prior to appointment.

## 2015-11-10 ENCOUNTER — Ambulatory Visit (INDEPENDENT_AMBULATORY_CARE_PROVIDER_SITE_OTHER): Payer: BLUE CROSS/BLUE SHIELD | Admitting: General Surgery

## 2015-11-10 ENCOUNTER — Telehealth: Payer: Self-pay | Admitting: Gastroenterology

## 2015-11-10 ENCOUNTER — Encounter: Payer: Self-pay | Admitting: General Surgery

## 2015-11-10 VITALS — BP 151/93 | HR 45 | Temp 98.3°F | Ht 69.0 in | Wt 233.0 lb

## 2015-11-10 DIAGNOSIS — Z4889 Encounter for other specified surgical aftercare: Secondary | ICD-10-CM

## 2015-11-10 NOTE — Telephone Encounter (Signed)
Please call patient. She needs an appointment for h. Pylori. She recently had her gallbladder removed. She also would like to talk to you regarding some of the medication that she is taking.

## 2015-11-10 NOTE — Telephone Encounter (Signed)
LVM for pt to return my call.

## 2015-11-10 NOTE — Patient Instructions (Addendum)
Please give us a call if you notice any redness, fever, and/or drainage.  At this time you are able to eat whatever you'll like to eat. Remember that if you eat fatty foods, you'll probably have diarrhea. This is normal so don't be alarmed and this will eventually stop. On your scar you are able to apply vitamin E or Mederma.

## 2015-11-10 NOTE — Progress Notes (Signed)
Outpatient Surgical Follow Up  11/10/2015  Cheryl Mosley is an 55 y.o. female.   Chief Complaint  Patient presents with  . Routine Post Op    HPI:  55 year old female returns to clinic 2 weeks status post laparoscopic cholecystectomy. Patient states that the pain she's had before surgery is now completely gone. She's been very happy with her surgical care. She denies any fevers, chills, nausea, vomiting, diarrhea, constipation, chest pain, short of breath. Patient has already return to work.  Past Medical History  Diagnosis Date  . Hypertension   . GERD (gastroesophageal reflux disease)   . Wears contact lenses     Past Surgical History  Procedure Laterality Date  . Colonoscopy  2015    normal- cleared for 10 yrs- Dr Servando SnareWohl  . Esophagogastroduodenoscopy (egd) with propofol N/A 09/19/2015    Procedure: ESOPHAGOGASTRODUODENOSCOPY (EGD) WITH PROPOFOL;  Surgeon: Midge Miniumarren Wohl, MD;  [facility];  Service: Endoscopy;  Laterality: N/A;  . Vaginal hysterectomy  2005    partial  . Cholecystectomy N/A 10/27/2015    Procedure: LAPAROSCOPIC CHOLECYSTECTOMY WITH INTRAOPERATIVE CHOLANGIOGRAM;  Surgeon: Ricarda Frameharles Dorothie Wah, MD;  Location: [facility];  Service: General;  Laterality: N/A;    Family History  Problem Relation Age of Onset  . Hypertension Sister     Social History:  reports that she has never smoked. She has never used smokeless tobacco. She reports that she does not drink alcohol or use illicit drugs.  Allergies: No Known Allergies  Medications reviewed.    ROS  a multipoint review of systems was completed. All pertinent positives and negatives within the history of present illness remainder negative.   BP 151/93 mmHg  Pulse 45  Temp(Src) 98.3 F (36.8 C) (Oral)  Ht 5\' 9"  (1.753 m)  Wt 105.688 kg (233 lb)  BMI 34.39 kg/m2  Physical Exam   Gen.: No acute distress Chest: Clear to auscultation Heart: Regular rate and rhythm Abdomen: Soft, nontender,  nondistended. Well approximated laparoscopic cholecystectomy incisions with Dermabond still in place.   No results found for this or any previous visit (from the past 48 hour(s)). No results found.  Assessment/Plan:  1. Aftercare following surgery  55 year old female status post laparoscopic cholecystectomy. Pathology reviewed with the patient. Discussed signs and symptoms of infection as well as indications for returning to clinic. Patient voiced understanding and will follow up on an as-needed basis.     Ricarda Frameharles Zain Lankford, MD FACS General Surgeon  11/10/2015,12:15 PM

## 2015-11-11 ENCOUNTER — Other Ambulatory Visit: Payer: Self-pay

## 2015-11-11 DIAGNOSIS — A048 Other specified bacterial intestinal infections: Secondary | ICD-10-CM

## 2015-11-11 NOTE — Telephone Encounter (Signed)
Pt returned call regarding her previous treatment for h pylori. Advised pt we repeat stool cultures 6 weeks after she completes her medication. Ordered stool culture and made an appt for pt to follow up with Dr. Servando SnareWohl on Wednesday, Jan 18th.

## 2015-11-16 ENCOUNTER — Ambulatory Visit (INDEPENDENT_AMBULATORY_CARE_PROVIDER_SITE_OTHER): Payer: BLUE CROSS/BLUE SHIELD | Admitting: Gastroenterology

## 2015-11-16 ENCOUNTER — Encounter: Payer: Self-pay | Admitting: Gastroenterology

## 2015-11-16 ENCOUNTER — Encounter: Payer: Self-pay | Admitting: Internal Medicine

## 2015-11-16 ENCOUNTER — Ambulatory Visit (INDEPENDENT_AMBULATORY_CARE_PROVIDER_SITE_OTHER): Payer: BLUE CROSS/BLUE SHIELD | Admitting: Internal Medicine

## 2015-11-16 VITALS — BP 131/81 | HR 75 | Temp 98.6°F | Ht 69.0 in | Wt 236.0 lb

## 2015-11-16 VITALS — BP 126/76 | HR 72 | Ht 68.0 in | Wt 236.0 lb

## 2015-11-16 DIAGNOSIS — K2970 Gastritis, unspecified, without bleeding: Secondary | ICD-10-CM | POA: Diagnosis not present

## 2015-11-16 DIAGNOSIS — K219 Gastro-esophageal reflux disease without esophagitis: Secondary | ICD-10-CM

## 2015-11-16 DIAGNOSIS — I10 Essential (primary) hypertension: Secondary | ICD-10-CM

## 2015-11-16 LAB — H. PYLORI ANTIGEN, STOOL: H pylori Ag, Stl: NEGATIVE

## 2015-11-16 MED ORDER — LOSARTAN POTASSIUM-HCTZ 100-25 MG PO TABS: 1.0000 | ORAL_TABLET | Freq: Every day | ORAL | Status: DC

## 2015-11-16 NOTE — Progress Notes (Signed)
Date:  11/16/2015   Name:  Cheryl Mosley   DOB:  1961/07/18   MRN:  [id number]   Chief Complaint: Hypertension Hypertension This is a chronic problem. The current episode started more than 1 year ago. The problem is unchanged. The problem is controlled. Pertinent negatives include no chest pain, palpitations or shortness of breath. Past treatments include calcium channel blockers, angiotensin blockers and diuretics. The current treatment provides significant improvement. There are no compliance problems.   She does think that she has developed a slight cough.  Also the pill is very large and hard to swallow.  Gastritis - Seeing Dr. Servando Snare.  Treated in the past for H Pylori.  EGD showed persistent gastritis and H pylori for which she was treated again.  She has an appointment with him today to discuss treatment other than omeprazole 40 mg daily.   Review of Systems  Constitutional: Negative for chills and fatigue.  HENT: Negative for hearing loss.   Eyes: Negative for visual disturbance.  Respiratory: Negative for choking, shortness of breath and wheezing.   Cardiovascular: Negative for chest pain, palpitations and leg swelling.  Gastrointestinal: Negative for abdominal pain, diarrhea, constipation and blood in stool.  Genitourinary: Negative for dysuria.  Psychiatric/Behavioral: Negative for sleep disturbance and dysphoric mood.    Patient Active Problem List   Diagnosis Date Noted  . 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 Heartburn   . Other specified diseases of esophagus   . Gastritis   . Essential (primary) hypertension 06/16/2015  . Combined fat and carbohydrate induced hyperlipemia 06/16/2015  . 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 Helicobacter pylori gastritis 06/14/2015    Prior to Admission medications   Medication Sig Start Date End Date Taking? Authorizing Provider  amLODipine (NORVASC) 5 MG tablet Take 1 tablet (5 mg total) by mouth  daily. Patient taking differently: Take 5 mg by mouth every morning.  07/06/15   Reubin Milan, MD  omeprazole (PRILOSEC) 40 MG capsule Take 1 capsule (40 mg total) by mouth daily. Patient taking differently: Take 40 mg by mouth every morning.  06/16/15   Duanne Limerick, MD  valsartan-hydrochlorothiazide (DIOVAN-HCT) 320-25 MG per tablet Take 1 tablet by mouth daily. 07/07/15   Reubin Milan, MD    No Known Allergies  Past Surgical History  Procedure Laterality Date  . Colonoscopy  2015    normal- cleared for 10 yrs- Dr Servando Snare  . Esophagogastroduodenoscopy (egd) with propofol N/A 09/19/2015    Procedure: ESOPHAGOGASTRODUODENOSCOPY (EGD) WITH PROPOFOL;  Surgeon: Midge Minium, MD;  Location: [facility];  Service: Endoscopy;  Laterality: N/A;  . Vaginal hysterectomy  2005    partial  . Cholecystectomy N/A 10/27/2015    Procedure: LAPAROSCOPIC CHOLECYSTECTOMY WITH INTRAOPERATIVE CHOLANGIOGRAM;  Surgeon: Ricarda Frame, MD;  Location: [facility];  Service: General;  Laterality: N/A;    Social History  Substance Use Topics  . Smoking status: Never Smoker   . Smokeless tobacco: Never Used  . Alcohol Use: No     Medication list has been reviewed and updated.   Physical Exam  Constitutional: She is oriented to person, place, and time. She appears well-developed. No distress.  HENT:  Head: Normocephalic and atraumatic.  Cardiovascular: Normal rate, regular rhythm and normal heart sounds.   Pulmonary/Chest: Effort normal and breath sounds normal. No respiratory distress.  Musculoskeletal: Normal range of motion.  Neurological: She is alert and oriented to person, place, and time.  Skin: Skin is warm and dry.  No rash noted.  Psychiatric: She has a normal mood and affect. Her behavior is normal. Thought content normal.  Nursing note and vitals reviewed.   BP 126/76 mmHg  Pulse 72  Ht  (1.727 m)  Wt 236 lb (107.049 kg)  BMI 35.89 kg/m2  Assessment and Plan: 1.  Essential (primary) hypertension Will change Diovan HCT to Hyzaar for smaller size pill Monitor BP at home - return if > 140/90 - losartan-hydrochlorothiazide (HYZAAR) 100-25 MG tablet; Take 1 tablet by mouth daily.  Dispense: 30 tablet; Refill: 3  2. Gastritis F/u with GI regarding H Pylori   [name], MD [facility]  11/16/2015

## 2015-11-16 NOTE — Progress Notes (Signed)
Primary Care Physician: [name], MD  Primary Gastroenterologist:  Dr. Midge Minium  Chief Complaint  Patient presents with  . Follow up h pylori    HPI: Cheryl Mosley is a 55 y.o. female here for follow-up after having a upper endoscopy with H. pylori being detected. The patient has had a treatment for her H. pylori. The patient also had her gallbladder out since seeing me last. The patient states she has no further abdominal pain and has been doing well.    Current Outpatient Prescriptions  Medication Sig Dispense Refill  . amLODipine (NORVASC) 5 MG tablet Take 1 tablet (5 mg total) by mouth daily. (Patient taking differently: Take 5 mg by mouth every morning. ) 30 tablet 12  . losartan-hydrochlorothiazide (HYZAAR) 100-25 MG tablet Take 1 tablet by mouth daily. 30 tablet 3  . omeprazole (PRILOSEC) 40 MG capsule Take 1 capsule (40 mg total) by mouth daily. (Patient taking differently: Take 40 mg by mouth every morning. ) 30 capsule 3   No current facility-administered medications for this visit.    Allergies as of 11/16/2015  . (No Known Allergies)    ROS:  General: Negative for anorexia, weight loss, fever, chills, fatigue, weakness. ENT: Negative for hoarseness, difficulty swallowing , nasal congestion. CV: Negative for chest pain, angina, palpitations, dyspnea on exertion, peripheral edema.  Respiratory: Negative for dyspnea at rest, dyspnea on exertion, cough, sputum, wheezing.  GI: See history of present illness. GU:  Negative for dysuria, hematuria, urinary incontinence, urinary frequency, nocturnal urination.  Endo: Negative for unusual weight change.    Physical Examination:   BP 131/81 mmHg  Pulse 75  Temp(Src) 98.6 F (37 C)  Ht  (1.753 m)  Wt 236 lb (107.049 kg)  BMI 34.84 kg/m2  General: Well-nourished, well-developed in no acute distress.  Eyes: No icterus. Conjunctivae pink. Mouth: Oropharyngeal mucosa moist and pink , no lesions erythema  or exudate. Lungs: Clear to auscultation bilaterally. Non-labored. Heart: Regular rate and rhythm, no murmurs rubs or gallops.  Abdomen: Bowel sounds are normal, nontender, nondistended, no hepatosplenomegaly or masses, no abdominal bruits or hernia , no rebound or guarding.   Extremities: No lower extremity edema. No clubbing or deformities. Neuro: Alert and oriented x 3.  Grossly intact. Skin: Warm and dry, no jaundice.   Psych: Alert and cooperative, normal mood and affect.  Labs:    Imaging Studies: Dg Cholangiogram Operative  10/27/2015  CLINICAL DATA:  Laparoscopic cholecystectomy. EXAM: INTRAOPERATIVE CHOLANGIOGRAM FLUOROSCOPY TIME:  2 minutes, 59 seconds COMPARISON:  Abdominal ultrasound - 09/26/2015; CT abdomen pelvis- 09/26/2015 FINDINGS: 9 intraoperative cholangiographic images of the right upper abdominal quadrant during laparoscopic cholecystectomy are provided for review. Surgical clips overlie the expected location of the gallbladder fossa. Extravasated contrast is noted about the gallbladder fossa and right upper abdominal quadrant. There is opacification of the common bile duct to the level of its distal aspect. While there is apparent minimal opacification of the descending segment of the duodenum, there is suboptimal opacification the distal aspect of the CBD. There is minimal reflux of injected contrast into the common hepatic duct and central aspect of the non dilated intrahepatic biliary system. There are no discrete filling defects within the opacified portions of the biliary system to suggest the presence of choledocholithiasis. IMPRESSION: Limited intraoperative cholangiogram without definitive evidence of choledocholithiasis. Correlation with the operative report is recommended. Electronically Signed   By: Simonne Come M.D.   On: 10/27/2015 11:02    Assessment and  Plan:   Cheryl Mosley is a 55 y.o. y/o female who had a stool test done 2 days ago for her H. pylori which  was negative for H. pylori antigen. The patient also reports that she has been on omeprazole for some time due to reflux and would like to come off the medication. The patient has been told to stop the medication and see if her symptoms of heartburn recur. If they do she should try to go back on 20 ng of omeprazole and if that does not work we will prescribe her 40 mg of omeprazole. The patient has been explained the plan and agrees with it.   Note: This dictation was prepared with Dragon dictation along with smaller phrase technology. Any transcriptional errors that result from this process are unintentional.

## 2016-03-26 IMAGING — CT CT CTA ABD/PEL W/CM AND/OR W/O CM
2 of 6 series · 13 of 46 positions shown, 15 images · IV contrast (APPLIED)
Comparison: None.

CLINICAL DATA: Left and mid chest pain that radiates to the back,
with nausea, present since May 2015.

EXAM:
CT ANGIOGRAPHY CHEST, ABDOMEN AND PELVIS
TECHNIQUE: Multidetector CT imaging through the chest, abdomen and pelvis was
performed using the standard protocol during bolus administration of
intravenous contrast. Multiplanar reconstructed images and MIPs were
obtained and reviewed to evaluate the vascular anatomy.
CONTRAST:  125mL OMNIPAQUE IOHEXOL 350 MG/ML SOLN

[Series 6: arterial · axial · arterial · 0.70mm/px · z∈[-612,-66]mm · 10 of 323 slices shown, 12 images]
[im 25/323  soft-tissue]
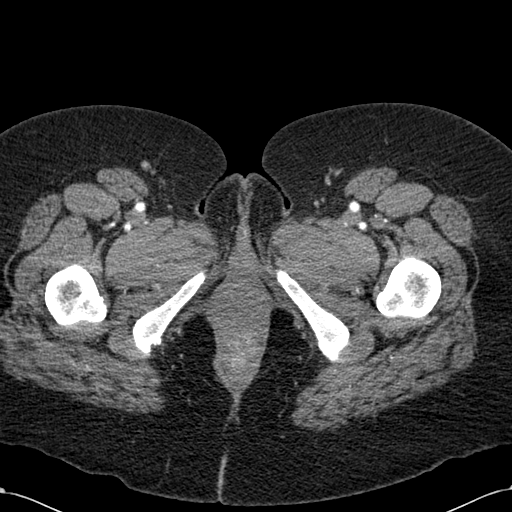
[im 25/323  bone]
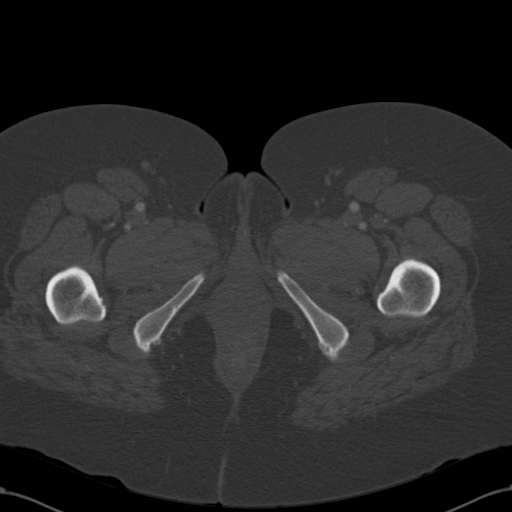
[im 50/323  soft-tissue]
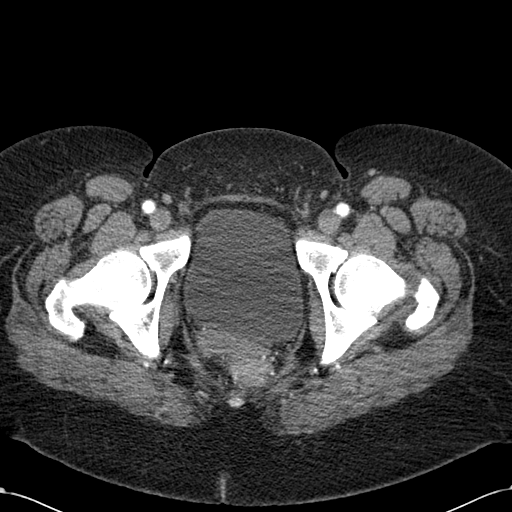
[im 87/323  soft-tissue]
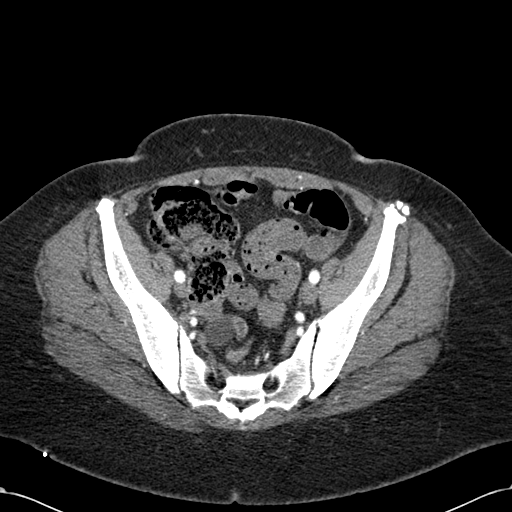
[im 112/323  soft-tissue]
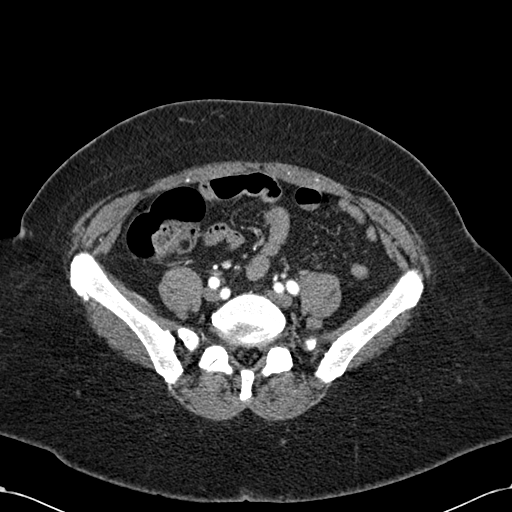
[im 149/323  soft-tissue]
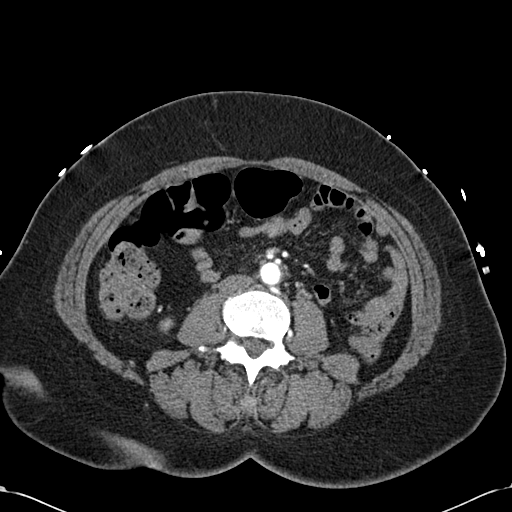
[im 174/323  soft-tissue]
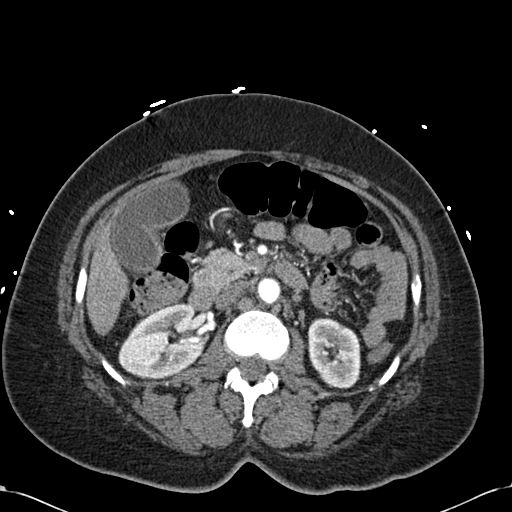
[im 211/323  soft-tissue]
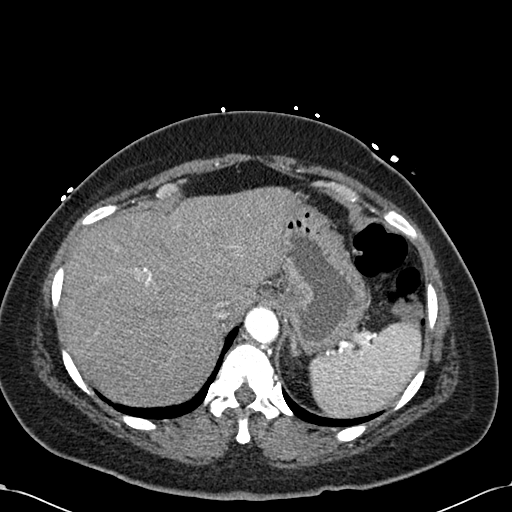
[im 236/323  soft-tissue]
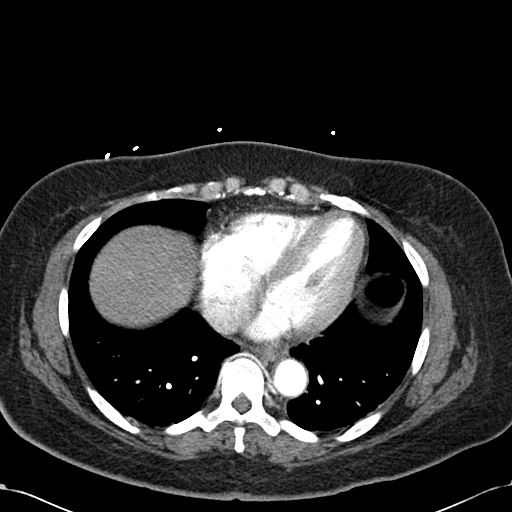
[im 273/323  soft-tissue]
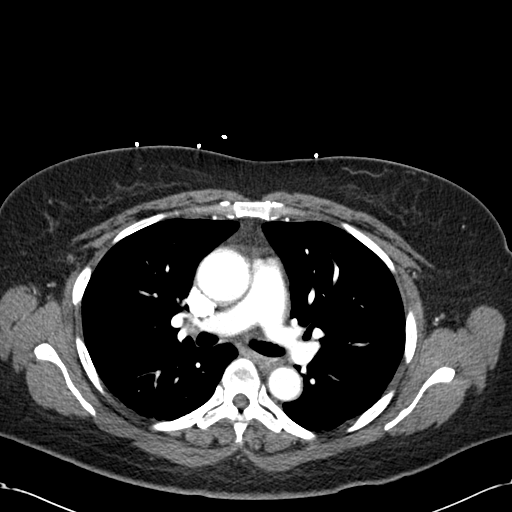
[im 273/323  bone]
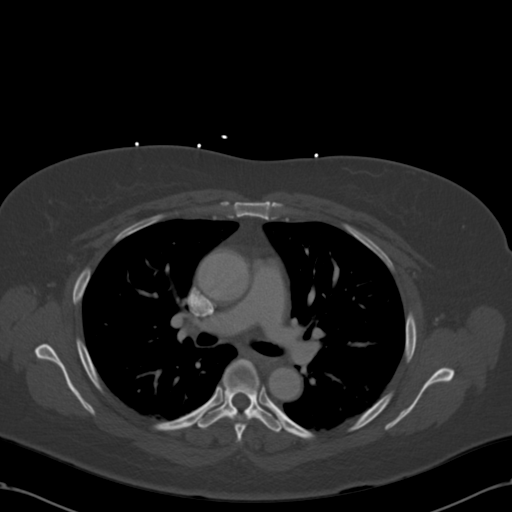
[im 298/323  soft-tissue]
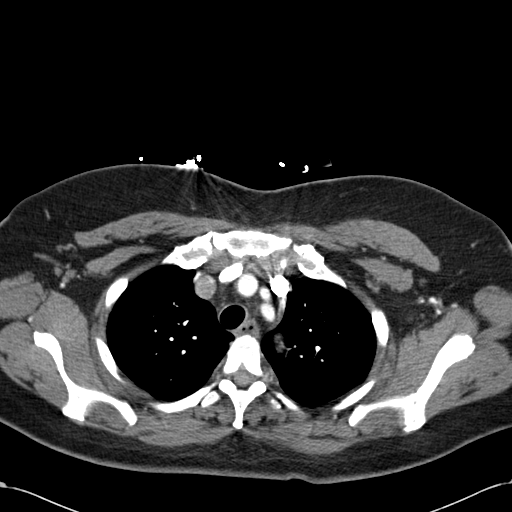

[Series 8: cor arterial mpr · coronal · arterial · 0.65mm/px · 3 of 114 slices shown]
[im 29/114  soft-tissue]
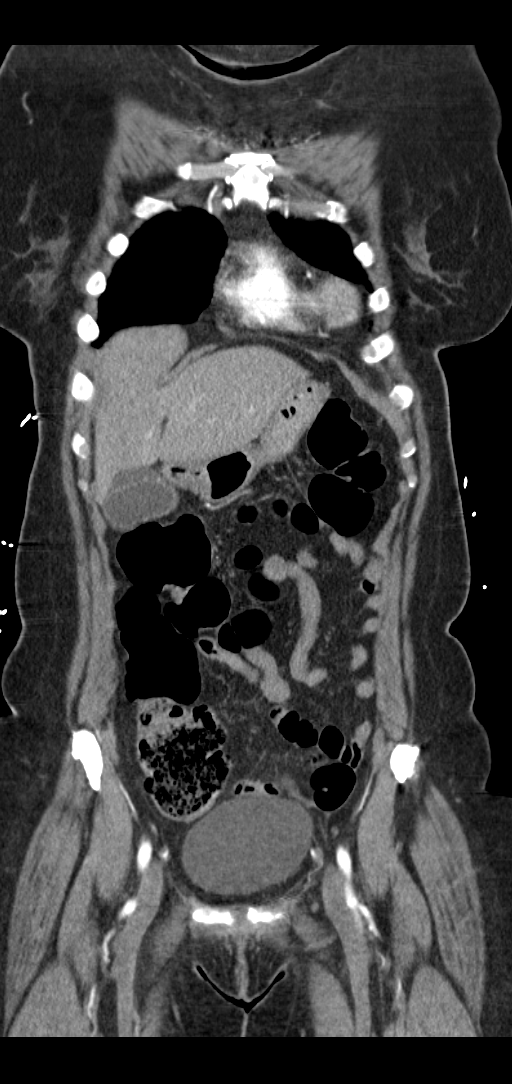
[im 57/114  soft-tissue]
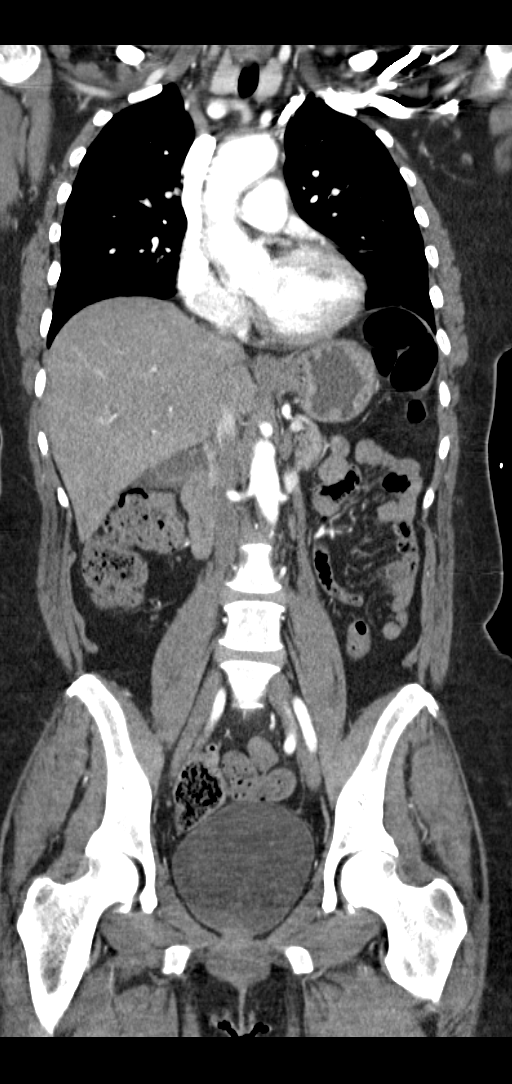
[im 85/114  soft-tissue]
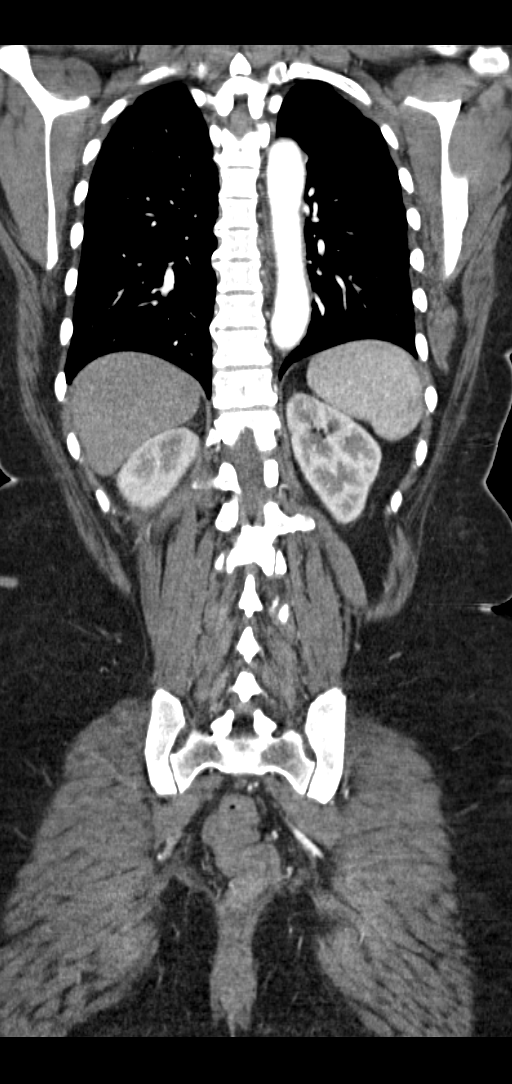

[13 of 46 positions shown; findings below may reference images not displayed]

FINDINGS: CTA CHEST FINDINGS

THORACIC INLET/BODY WALL:

No acute abnormality.

MEDIASTINUM:

Normal heart size. No pericardial effusion. Noncontrast chest CT
negative for intramural hematoma. No aortic dissection or aneurysm.
Noncalcified atheromatous wall thickening of the descending aorta
without stenosis. Opacified pulmonary arteries are negative for
filling defect. No adenopathy.

LUNG WINDOWS:

There is no edema, consolidation, effusion, or pneumothorax.

OSSEOUS:

No acute finding.  No suspicious lytic or blastic lesions.

Review of the MIP images confirms the above findings.

CTA ABDOMEN AND PELVIS FINDINGS

Abdominal wall:  No contributory findings.

Hepatobiliary: Sub cm hypervascular focus in the subcapsular high
central liver is likely perfusion anomaly based on shape.Wall
thickening and pericholecystic edema without visible cholelithiasis.
No bile duct enlargement.

Pancreas: Unremarkable.

Spleen: Nonspecific but benign and circumscribed appearing cysts or
pseudocysts measuring 
1 cm or smaller.

Adrenals/Urinary Tract: Negative adrenals. No hydronephrosis or
stone. Unremarkable bladder.

Reproductive:Hysterectomy. Neighboring or septated (see coronal
reformats) right adnexal cyst/s, in total measuring up to 42 mm,
individually measuring up to 32 mm.

Stomach/Bowel: No obstruction. No appendicitis. Internal
hemorrhoids.

Vascular/Lymphatic: Standard aortic branching. There is predominant
noncalcified atheromatous wall thickening of the abdominal aorta
without aneurysm. No major branch occlusion or flow limiting
stenosis. No inflammatory wall thickening. No mass or adenopathy.

Peritoneal: No ascites or pneumoperitoneum.

Musculoskeletal: No acute finding.

Lower lumbar facet arthropathy with moderate overgrowth. There is
sclerosis and subchondral cystic change to both sacroiliac joints
and the symphysis pubis, suggesting an inflammatory arthropathy.
Notable absence of erosive changes on the sacral side. No ankylosis
or evidence of spondyloarthropathy.

Review of the MIP images confirms the above findings.
IMPRESSION: 1. Pericholecystic edema concerning for cholecystitis, but no
calcified gallstones. Consider right upper quadrant ultrasound to
evaluate for cholelithiasis and Murphy sign.
2. Aortic atherosclerosis without aneurysm or dissection.
3. Neighboring or septated right adnexal cysts, up to 4 cm.
Recommend nonemergent pelvic ultrasound.
4. Additional incidental findings described above.

## 2016-04-26 IMAGING — CR DG CHOLANGIOGRAM OPERATIVE
3 series · 14 of 26 positions shown · non-contrast
Comparison: Abdominal ultrasound - 09/26/2015; CT abdomen pelvis-
09/26/2015

CLINICAL DATA: Laparoscopic cholecystectomy.

EXAM:
INTRAOPERATIVE CHOLANGIOGRAM
FLUOROSCOPY TIME:  2 minutes, 59 seconds

[Series 33: cont. · 5 of 25 frames shown (1 of 2)]
[frame 1/25]
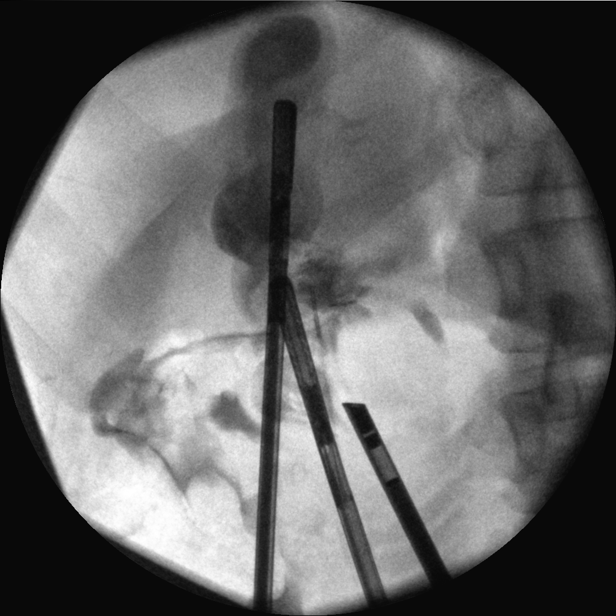
[frame 6/25]
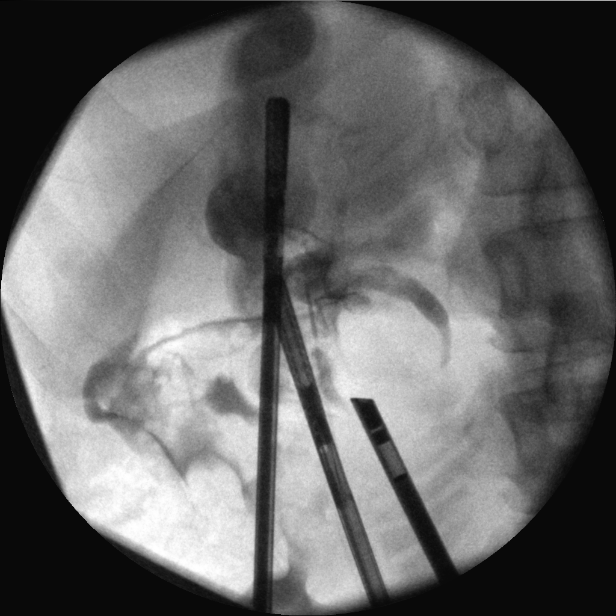
[frame 11/25]
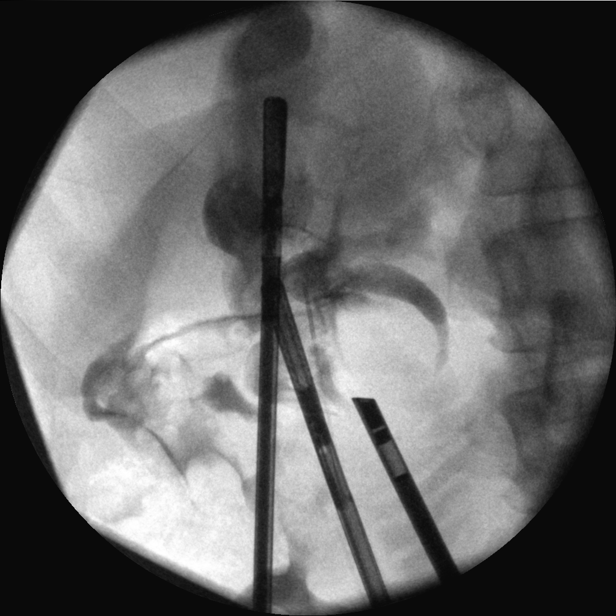
[frame 17/25]
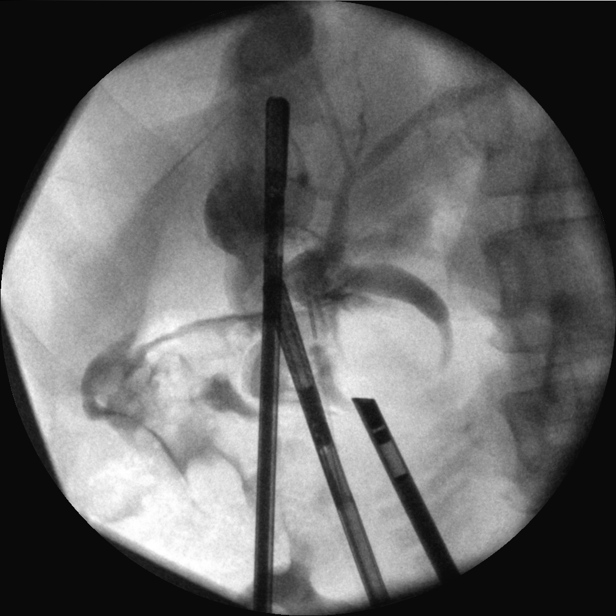
[frame 22/25]
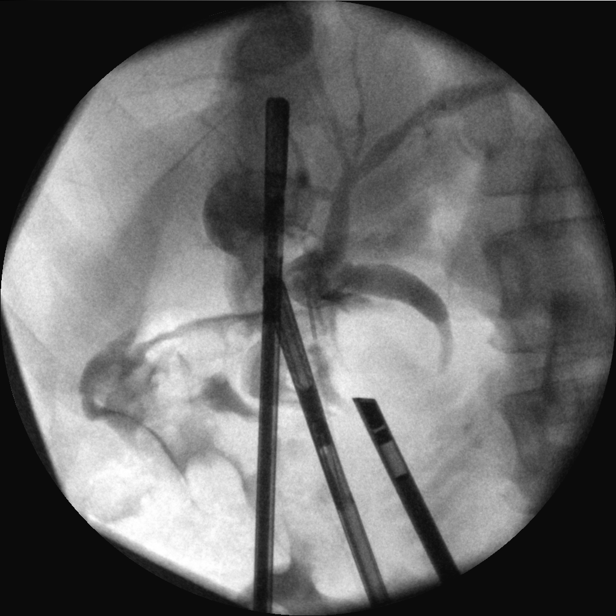

[Series 34: cont. · 5 of 9 frames shown (2 of 2)]
[frame 1/9]
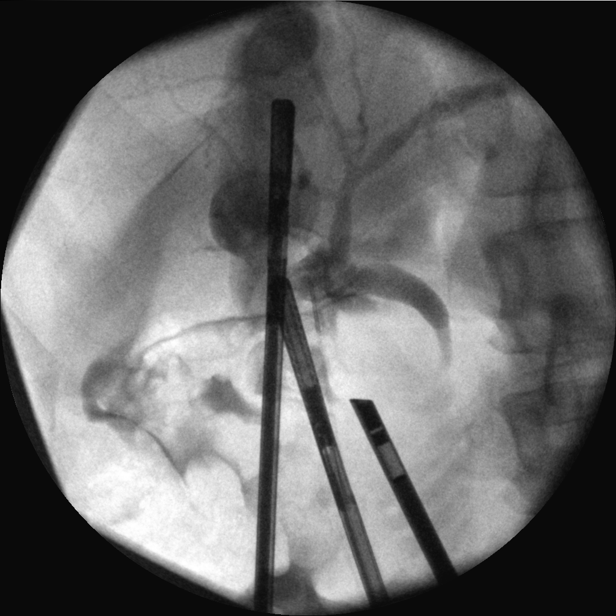
[frame 3/9]
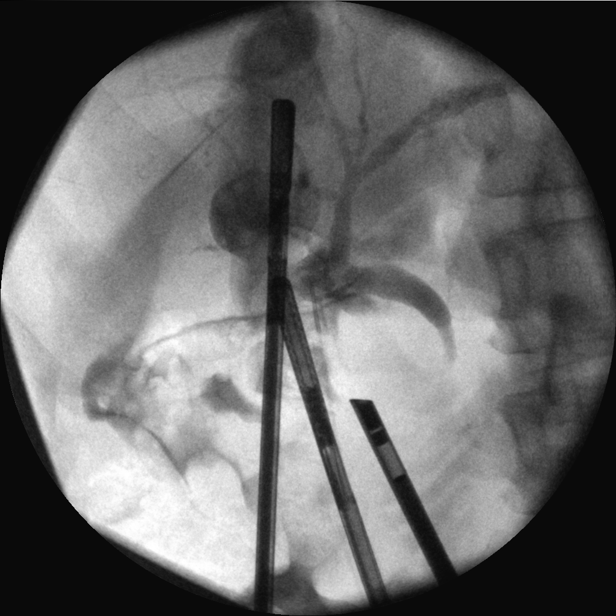
[frame 4/9]
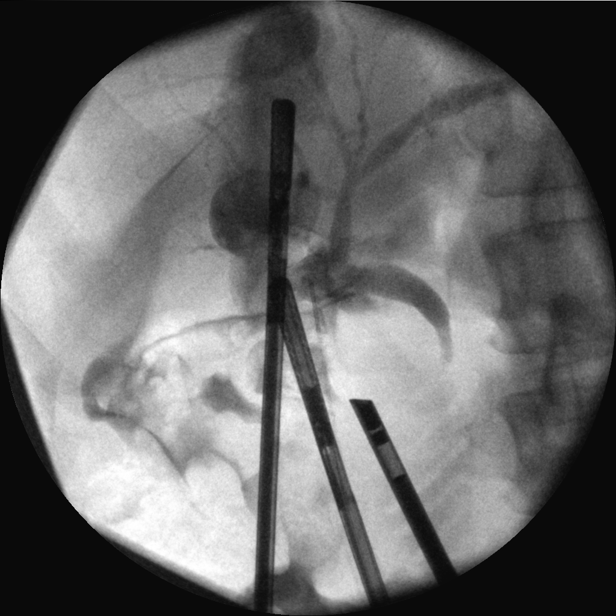
[frame 6/9]
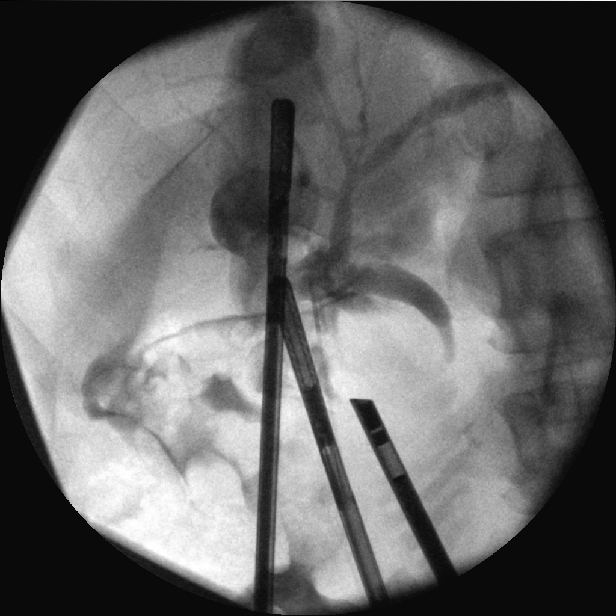
[frame 8/9]
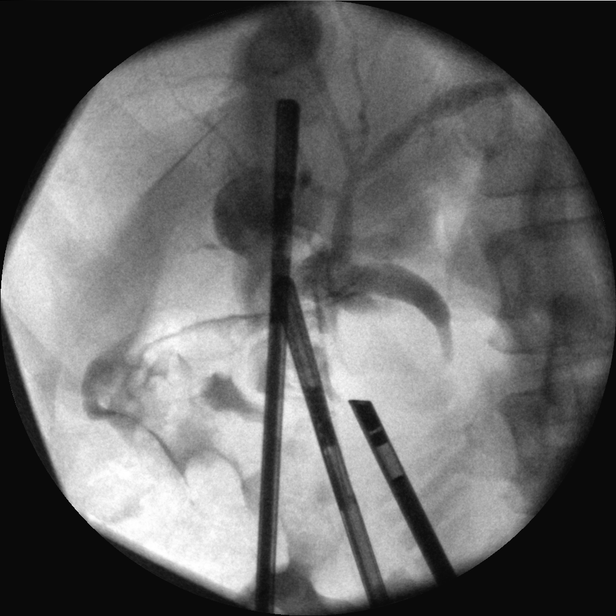

[Series 6001: 1 · 4 of 7 slices shown]
[im 1/7]
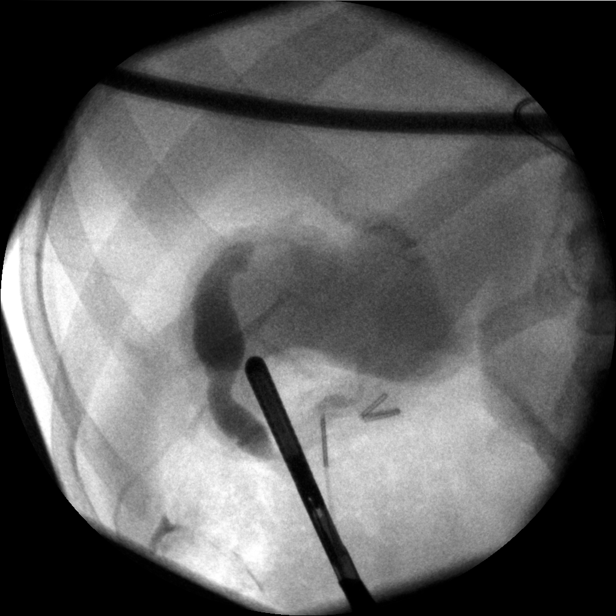
[im 3/7]
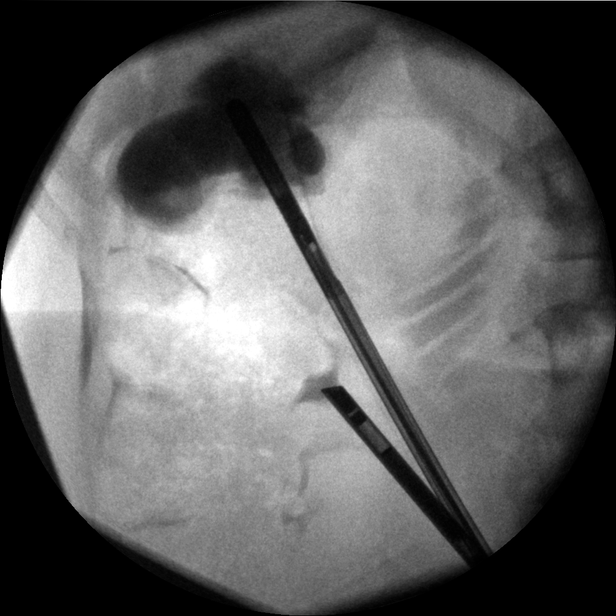
[im 5/7]
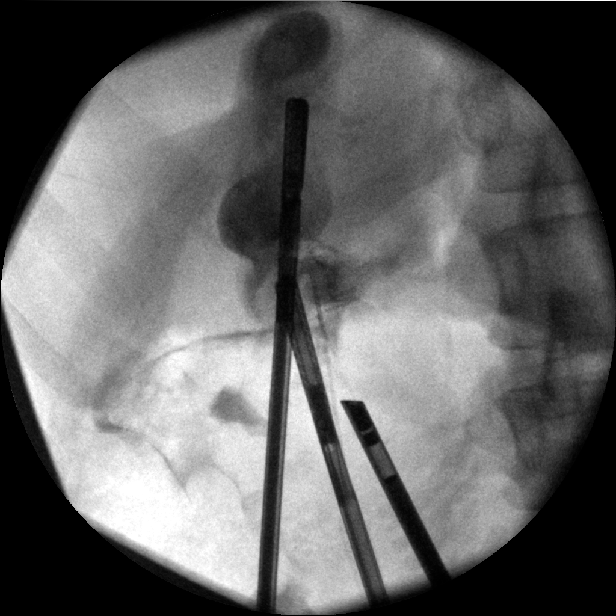
[im 7/7]
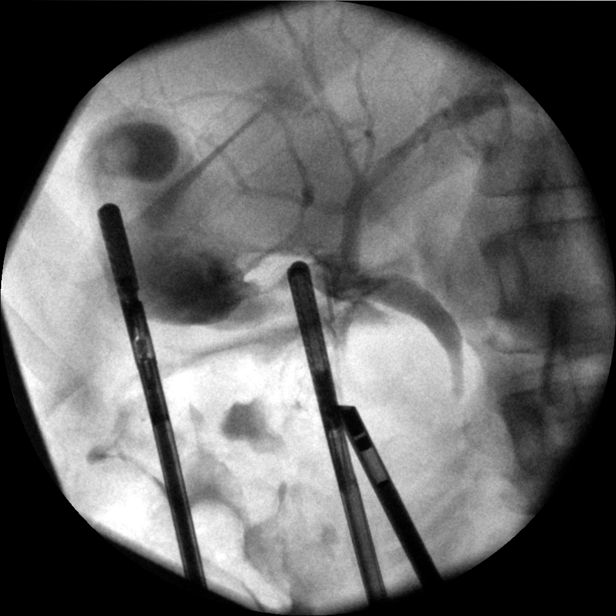

[14 of 26 positions shown; findings below may reference images not displayed]

FINDINGS: 9 intraoperative cholangiographic images of the right upper
abdominal quadrant during laparoscopic cholecystectomy are provided
for review.

Surgical clips overlie the expected location of the gallbladder
fossa. Extravasated contrast is noted about the gallbladder fossa
and right upper abdominal quadrant.

There is opacification of the common bile duct to the level of its
distal aspect.

While there is apparent minimal opacification of the descending
segment of the duodenum, there is suboptimal opacification the
distal aspect of the CBD.

There is minimal reflux of injected contrast into the common hepatic
duct and central aspect of the non dilated intrahepatic biliary
system.

There are no discrete filling defects within the opacified portions
of the biliary system to suggest the presence of
choledocholithiasis.
IMPRESSION: Limited intraoperative cholangiogram without definitive evidence of
choledocholithiasis. Correlation with the operative report is
recommended.

## 2016-04-30 ENCOUNTER — Other Ambulatory Visit: Payer: Self-pay | Admitting: Internal Medicine

## 2016-05-02 NOTE — Telephone Encounter (Signed)
Patient is scheduled for 6 month follow up on 7/14 @ 10:15 am

## 2016-05-11 ENCOUNTER — Ambulatory Visit (INDEPENDENT_AMBULATORY_CARE_PROVIDER_SITE_OTHER): Payer: BLUE CROSS/BLUE SHIELD | Admitting: Internal Medicine

## 2016-05-11 ENCOUNTER — Encounter: Payer: Self-pay | Admitting: Internal Medicine

## 2016-05-11 VITALS — BP 126/84 | HR 82 | Resp 16 | Ht 68.0 in | Wt 242.0 lb

## 2016-05-11 DIAGNOSIS — Z1231 Encounter for screening mammogram for malignant neoplasm of breast: Secondary | ICD-10-CM | POA: Diagnosis not present

## 2016-05-11 DIAGNOSIS — I10 Essential (primary) hypertension: Secondary | ICD-10-CM | POA: Diagnosis not present

## 2016-05-11 MED ORDER — LOSARTAN POTASSIUM-HCTZ 100-25 MG PO TABS: 1.0000 | ORAL_TABLET | Freq: Every day | ORAL | Status: DC

## 2016-05-11 MED ORDER — AMLODIPINE BESYLATE 5 MG PO TABS: 5.0000 mg | ORAL_TABLET | Freq: Every day | ORAL | Status: DC

## 2016-05-11 NOTE — Progress Notes (Signed)
Date:  05/11/2016   Name:  Cheryl Mosley   DOB:  09-20-1961   MRN:  [id number]   Chief Complaint: Hypertension Hypertension This is a chronic problem. The current episode started more than 1 year ago. The problem is unchanged. The problem is controlled. Pertinent negatives include no chest pain, headaches, palpitations or shortness of breath. Past treatments include calcium channel blockers, angiotensin blockers and diuretics. The current treatment provides significant improvement.  Lat visit changed diovan hct to Hyzaar.    Review of Systems  Constitutional: Negative for fever, appetite change, fatigue and unexpected weight change.  HENT: Negative for tinnitus and trouble swallowing.   Eyes: Negative for visual disturbance.  Respiratory: Negative for cough, chest tightness and shortness of breath.   Cardiovascular: Negative for chest pain, palpitations and leg swelling.  Gastrointestinal: Negative for abdominal pain.  Endocrine: Negative for polydipsia and polyuria.  Genitourinary: Negative for dysuria and hematuria.  Musculoskeletal: Negative for arthralgias.  Neurological: Negative for dizziness, tremors, numbness and headaches.  Psychiatric/Behavioral: Negative for dysphoric mood.    Patient Active Problem List   Diagnosis Date Noted  . 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 Heartburn   . Other specified diseases of esophagus   . Gastritis   . Essential (primary) hypertension 06/16/2015  . Combined fat and carbohydrate induced hyperlipemia 06/16/2015  . 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 Helicobacter pylori gastritis 06/14/2015    Prior to Admission medications   Medication Sig Start Date End Date Taking? Authorizing Provider  amLODipine (NORVASC) 5 MG tablet Take 1 tablet (5 mg total) by mouth daily. Patient taking differently: Take 5 mg by mouth every morning.  07/06/15  Yes Reubin MilanLaura H Donnie Gedeon, MD  losartan-hydrochlorothiazide [facility](HYZAAR)  100-25 MG tablet take 1 tablet by mouth once daily 05/01/16  Yes Reubin MilanLaura H Clarnce Homan, MD    No Known Allergies  Past Surgical History  Procedure Laterality Date  . Colonoscopy  2015    normal- cleared for 10 yrs- Dr Servando SnareWohl  . Esophagogastroduodenoscopy (egd) with propofol N/A 09/19/2015    Procedure: ESOPHAGOGASTRODUODENOSCOPY (EGD) WITH PROPOFOL;  Surgeon: Midge Miniumarren Wohl, MD;  [facility];  Service: Endoscopy;  Laterality: N/A;  . Vaginal hysterectomy  2005    partial  . Cholecystectomy N/A 10/27/2015    Procedure: LAPAROSCOPIC CHOLECYSTECTOMY WITH INTRAOPERATIVE CHOLANGIOGRAM;  Surgeon: Ricarda Frameharles Woodham, MD;  Location: [facility];  Service: General;  Laterality: N/A;    Social History  Substance Use Topics  . Smoking status: Never Smoker   . Smokeless tobacco: Never Used  . Alcohol Use: No     Medication list has been reviewed and updated.   Physical Exam  Constitutional: She is oriented to person, place, and time. She appears well-developed. No distress.  HENT:  Head: Normocephalic and atraumatic.  Neck: Normal range of motion. Neck supple. No thyromegaly present.  Cardiovascular: Normal rate, regular rhythm and normal heart sounds.   Pulmonary/Chest: Effort normal and breath sounds normal. No respiratory distress.  Musculoskeletal: She exhibits no edema or tenderness.  Neurological: She is alert and oriented to person, place, and time.  Skin: Skin is warm and dry. No rash noted.  Psychiatric: She has a normal mood and affect. Her behavior is normal. Thought content normal.    BP 126/84 mmHg  Pulse 82  Resp 16  Ht 5\' 8"  (1.727 m)  Wt 242 lb (109.77 kg)  BMI 36.80 kg/m2  SpO2 98%  Assessment and Plan: 1. Essential (primary) hypertension Controlled - continue  current regimen - losartan-hydrochlorothiazide (HYZAAR) 100-25 MG tablet; Take 1 tablet by mouth daily.  Dispense: 30 tablet; Refill: 12 - amLODipine (NORVASC) 5 MG tablet; Take 1 tablet (5 mg total) by  mouth daily.  Dispense: 30 tablet; Refill: 12  2. Encounter for screening mammogram for breast cancer Pt will schedule mammogram - order already in system.   [name], MD [facility]  05/11/2016

## 2016-05-28 ENCOUNTER — Ambulatory Visit
Admission: RE | Admit: 2016-05-28 | Discharge: 2016-05-28 | Disposition: A | Discharge status: Home / Self Care | Payer: BLUE CROSS/BLUE SHIELD | Source: Ambulatory Visit | Attending: Internal Medicine | Admitting: Internal Medicine

## 2016-05-28 ENCOUNTER — Other Ambulatory Visit: Payer: Self-pay | Admitting: Internal Medicine

## 2016-05-28 DIAGNOSIS — Z1231 Encounter for screening mammogram for malignant neoplasm of breast: Secondary | ICD-10-CM | POA: Diagnosis present

## 2016-05-28 DIAGNOSIS — Z1239 Encounter for other screening for malignant neoplasm of breast: Secondary | ICD-10-CM

## 2016-08-06 ENCOUNTER — Encounter: Payer: Self-pay | Admitting: Internal Medicine

## 2016-08-06 ENCOUNTER — Ambulatory Visit (INDEPENDENT_AMBULATORY_CARE_PROVIDER_SITE_OTHER): Payer: BLUE CROSS/BLUE SHIELD | Admitting: Internal Medicine

## 2016-08-06 VITALS — BP 118/82 | HR 89 | Resp 16 | Ht 68.0 in | Wt 248.0 lb

## 2016-08-06 DIAGNOSIS — E782 Mixed hyperlipidemia: Secondary | ICD-10-CM

## 2016-08-06 DIAGNOSIS — Z0000 Encounter for general adult medical examination without abnormal findings: Secondary | ICD-10-CM | POA: Diagnosis not present

## 2016-08-06 DIAGNOSIS — Z1159 Encounter for screening for other viral diseases: Secondary | ICD-10-CM

## 2016-08-06 DIAGNOSIS — Z114 Encounter for screening for human immunodeficiency virus [HIV]: Secondary | ICD-10-CM | POA: Diagnosis not present

## 2016-08-06 DIAGNOSIS — I10 Essential (primary) hypertension: Secondary | ICD-10-CM

## 2016-08-06 DIAGNOSIS — Z23 Encounter for immunization: Secondary | ICD-10-CM | POA: Diagnosis not present

## 2016-08-06 LAB — POC URINALYSIS WITH MICROSCOPIC (NON AUTO)MANUAL RESULT
Bilirubin, UA: NEGATIVE
Blood, UA: NEGATIVE
Crystals: 0
EPITHELIAL CELLS, URINE PER MICROSCOPY: 3
Glucose, UA: NEGATIVE
KETONES UA: NEGATIVE
Leukocytes, UA: NEGATIVE
MUCUS UA: 0
PH UA: 7
PROTEIN UA: NEGATIVE
RBC: 0 M/uL — AB (ref 4.04–5.48)
Spec Grav, UA: 1.01
UROBILINOGEN UA: 0.2
WBC CASTS UA: 0

## 2016-08-06 MED ORDER — LOSARTAN POTASSIUM-HCTZ 100-25 MG PO TABS: 1.0000 | ORAL_TABLET | Freq: Every day | ORAL | 3 refills | Status: DC

## 2016-08-06 MED ORDER — AMLODIPINE BESYLATE 5 MG PO TABS: 5.0000 mg | ORAL_TABLET | Freq: Every day | ORAL | 3 refills | Status: DC

## 2016-08-06 NOTE — Progress Notes (Signed)
Date:  08/06/2016   Name:  Cheryl Mosley   DOB:  [birth year]   MRN:  [id number]  Chief Complaint: Annual Exam (No PAP Hysterectomy ) Hypertension  This is a chronic problem. The current episode started more than 1 year ago. The problem is controlled. Pertinent negatives include no chest pain, headaches, palpitations or shortness of breath. There are no associated agents to hypertension. Past treatments include angiotensin blockers and calcium channel blockers. The current treatment provides significant improvement.     Cheryl Mosley is a 55 y.o. female who presents today for her Complete Annual Exam. She feels well. She reports exercising some. She reports she is sleeping well. Her mammogram was done recently. She denies breast complaints.    Review of Systems  Constitutional: Negative for chills, fatigue and fever.  HENT: Negative for congestion, hearing loss, tinnitus, trouble swallowing and voice change.   Eyes: Negative for visual disturbance.  Respiratory: Negative for cough, chest tightness, shortness of breath and wheezing.   Cardiovascular: Negative for chest pain, palpitations and leg swelling.  Gastrointestinal: Negative for abdominal pain, constipation, diarrhea and vomiting.  Endocrine: Negative for polydipsia and polyuria.  Genitourinary: Negative for dysuria, frequency, genital sores, vaginal bleeding and vaginal discharge.  Musculoskeletal: Negative for arthralgias, gait problem and joint swelling.  Skin: Negative for color change and rash.  Neurological: Negative for dizziness, tremors, light-headedness and headaches.  Hematological: Negative for adenopathy. Does not bruise/bleed easily.  Psychiatric/Behavioral: Negative for dysphoric mood and sleep disturbance. The patient is not nervous/anxious.     Patient Active Problem List   Diagnosis Date Noted  . 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 Heartburn   . Other specified diseases of esophagus   .  Gastritis   . Essential (primary) hypertension 06/16/2015  . Combined fat and carbohydrate induced hyperlipemia 06/16/2015  . 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 Helicobacter pylori gastritis 06/14/2015    Prior to Admission medications   Medication Sig Start Date End Date Taking? Authorizing Provider  amLODipine (NORVASC) 5 MG tablet Take 1 tablet (5 mg total) by mouth daily. 05/11/16  Yes Reubin Milan, MD  losartan-hydrochlorothiazide (HYZAAR) 100-25 MG tablet Take 1 tablet by mouth daily. 05/11/16  Yes Reubin Milan, MD    No Known Allergies  Past Surgical History:  Procedure Laterality Date  . CHOLECYSTECTOMY N/A 10/27/2015   Procedure: LAPAROSCOPIC CHOLECYSTECTOMY WITH INTRAOPERATIVE CHOLANGIOGRAM;  Surgeon: Ricarda Frame, MD;  Location: [facility];  Service: General;  Laterality: N/A;  . COLONOSCOPY  2015   normal- cleared for 10 yrs- Dr Servando Snare  . ESOPHAGOGASTRODUODENOSCOPY (EGD) WITH PROPOFOL N/A 09/19/2015   Procedure: ESOPHAGOGASTRODUODENOSCOPY (EGD) WITH PROPOFOL;  Surgeon: Midge Minium, MD;  Location: [facility];  Service: Endoscopy;  Laterality: N/A;  . VAGINAL HYSTERECTOMY  2005   partial    Social History  Substance Use Topics  . Smoking status: Never Smoker  . Smokeless tobacco: Never Used  . Alcohol use No     Medication list has been reviewed and updated.   Physical Exam  Constitutional: She is oriented to person, place, and time. She appears well-developed and well-nourished. No distress.  HENT:  Head: Normocephalic and atraumatic.  Right Ear: Tympanic membrane and ear canal normal.  Left Ear: Tympanic membrane and ear canal normal.  Nose: Right sinus exhibits no maxillary sinus tenderness. Left sinus exhibits no maxillary sinus tenderness.  Mouth/Throat: Uvula is midline and oropharynx is clear and moist.  Eyes: Conjunctivae and EOM are normal.  Right eye exhibits no discharge. Left eye exhibits no discharge. No scleral  icterus.  Neck: Normal range of motion. Carotid bruit is not present. No erythema present. No thyromegaly present.  Cardiovascular: Normal rate, regular rhythm, normal heart sounds and normal pulses.   Pulmonary/Chest: Effort normal. No respiratory distress. She has no wheezes. Right breast exhibits no mass, no nipple discharge, no skin change and no tenderness. Left breast exhibits no mass, no nipple discharge, no skin change and no tenderness.  Abdominal: Soft. Bowel sounds are normal. There is no hepatosplenomegaly. There is no tenderness. There is no CVA tenderness.  Musculoskeletal: Normal range of motion. She exhibits no edema or tenderness.  Lymphadenopathy:    She has no cervical adenopathy.    She has no axillary adenopathy.  Neurological: She is alert and oriented to person, place, and time. She has normal reflexes. No cranial nerve deficit or sensory deficit.  Skin: Skin is warm, dry and intact. No rash noted.  Psychiatric: She has a normal mood and affect. Her speech is normal and behavior is normal. Thought content normal.  Nursing note and vitals reviewed.   BP 118/82   Pulse 89   Resp 16   Ht 5\' 8"  (1.727 m)   Wt 248 lb (112.5 kg)   SpO2 99%   BMI 37.71 kg/m   Assessment and Plan: 1. Annual physical exam Begin regular exercise Mammogram, colonoscopy up to date - POC urinalysis w microscopic (non auto)  2. Essential (primary) hypertension controlled - amLODipine (NORVASC) 5 MG tablet; Take 1 tablet (5 mg total) by mouth daily.  Dispense: 90 tablet; Refill: 3 - losartan-hydrochlorothiazide (HYZAAR) 100-25 MG tablet; Take 1 tablet by mouth daily.  Dispense: 90 tablet; Refill: 3 - CBC with Differential/Platelet  3. Combined fat and carbohydrate induced hyperlipemia Will advise when labs return - Comprehensive metabolic panel - Lipid panel  4. Need for hepatitis C screening test - Hepatitis C antibody  5. Need for influenza vaccination - Flu Vaccine QUAD 36+ mos  IM  6. Encounter for screening for HIV - HIV antibody   Bari EdwardLaura Shawny Borkowski, MD [facility]  08/06/2016

## 2016-08-06 NOTE — Patient Instructions (Signed)
DASH Eating Plan  DASH stands for "Dietary Approaches to Stop Hypertension." The DASH eating plan is a healthy eating plan that has been shown to reduce high blood pressure (hypertension). Additional health benefits may include reducing the risk of type 2 diabetes mellitus, heart disease, and stroke. The DASH eating plan may also help with weight loss.  WHAT DO I NEED TO KNOW ABOUT THE DASH EATING PLAN?  For the DASH eating plan, you will follow these general guidelines:  · Choose foods with a percent daily value for sodium of less than 5% (as listed on the food label).  · Use salt-free seasonings or herbs instead of table salt or sea salt.  · Check with your health care provider or pharmacist before using salt substitutes.  · Eat lower-sodium products, often labeled as "lower sodium" or "no salt added."  · Eat fresh foods.  · Eat more vegetables, fruits, and low-fat dairy products.  · Choose whole grains. Look for the word "whole" as the first word in the ingredient list.  · Choose fish and skinless chicken or turkey more often than red meat. Limit fish, poultry, and meat to 6 oz (170 g) each day.  · Limit sweets, desserts, sugars, and sugary drinks.  · Choose heart-healthy fats.  · Limit cheese to 1 oz (28 g) per day.  · Eat more home-cooked food and less restaurant, buffet, and fast food.  · Limit fried foods.  · Cook foods using methods other than frying.  · Limit canned vegetables. If you do use them, rinse them well to decrease the sodium.  · When eating at a restaurant, ask that your food be prepared with less salt, or no salt if possible.  WHAT FOODS CAN I EAT?  Seek help from a dietitian for individual calorie needs.  Grains  Whole grain or whole wheat bread. Brown rice. Whole grain or whole wheat pasta. Quinoa, bulgur, and whole grain cereals. Low-sodium cereals. Corn or whole wheat flour tortillas. Whole grain cornbread. Whole grain crackers. Low-sodium crackers.  Vegetables  Fresh or frozen vegetables  (raw, steamed, roasted, or grilled). Low-sodium or reduced-sodium tomato and vegetable juices. Low-sodium or reduced-sodium tomato sauce and paste. Low-sodium or reduced-sodium canned vegetables.   Fruits  All fresh, canned (in natural juice), or frozen fruits.  Meat and Other Protein Products  Ground beef (85% or leaner), grass-fed beef, or beef trimmed of fat. Skinless chicken or turkey. Ground chicken or turkey. Pork trimmed of fat. All fish and seafood. Eggs. Dried beans, peas, or lentils. Unsalted nuts and seeds. Unsalted canned beans.  Dairy  Low-fat dairy products, such as skim or 1% milk, 2% or reduced-fat cheeses, low-fat ricotta or cottage cheese, or plain low-fat yogurt. Low-sodium or reduced-sodium cheeses.  Fats and Oils  Tub margarines without trans fats. Light or reduced-fat mayonnaise and salad dressings (reduced sodium). Avocado. Safflower, olive, or canola oils. Natural peanut or almond butter.  Other  Unsalted popcorn and pretzels.  The items listed above may not be a complete list of recommended foods or beverages. Contact your dietitian for more options.  WHAT FOODS ARE NOT RECOMMENDED?  Grains  White bread. White pasta. White rice. Refined cornbread. Bagels and croissants. Crackers that contain trans fat.  Vegetables  Creamed or fried vegetables. Vegetables in a cheese sauce. Regular canned vegetables. Regular canned tomato sauce and paste. Regular tomato and vegetable juices.  Fruits  Dried fruits. Canned fruit in light or heavy syrup. Fruit juice.  Meat and Other Protein   Products  Fatty cuts of meat. Ribs, chicken wings, bacon, sausage, bologna, salami, chitterlings, fatback, hot dogs, bratwurst, and packaged luncheon meats. Salted nuts and seeds. Canned beans with salt.  Dairy  Whole or 2% milk, cream, half-and-half, and cream cheese. Whole-fat or sweetened yogurt. Full-fat cheeses or blue cheese. Nondairy creamers and whipped toppings. Processed cheese, cheese spreads, or cheese  curds.  Condiments  Onion and garlic salt, seasoned salt, table salt, and sea salt. Canned and packaged gravies. Worcestershire sauce. Tartar sauce. Barbecue sauce. Teriyaki sauce. Soy sauce, including reduced sodium. Steak sauce. Fish sauce. Oyster sauce. Cocktail sauce. Horseradish. Ketchup and mustard. Meat flavorings and tenderizers. Bouillon cubes. Hot sauce. Tabasco sauce. Marinades. Taco seasonings. Relishes.  Fats and Oils  Butter, stick margarine, lard, shortening, ghee, and bacon fat. Coconut, palm kernel, or palm oils. Regular salad dressings.  Other  Pickles and olives. Salted popcorn and pretzels.  The items listed above may not be a complete list of foods and beverages to avoid. Contact your dietitian for more information.  WHERE CAN I FIND MORE INFORMATION?  National Heart, Lung, and Blood Institute: www.nhlbi.nih.gov/health/health-topics/topics/dash/     This information is not intended to replace advice given to you by your health care provider. Make sure you discuss any questions you have with your health care provider.     Document Released: 10/04/2011 Document Revised: 11/05/2014 Document Reviewed: 08/19/2013  Elsevier Interactive Patient Education ©2016 Elsevier Inc.

## 2016-08-07 LAB — COMPREHENSIVE METABOLIC PANEL
A/G RATIO: 1.1 — AB (ref 1.2–2.2)
ALBUMIN: 3.9 g/dL (ref 3.5–5.5)
ALK PHOS: 118 IU/L — AB (ref 39–117)
ALT: 21 IU/L (ref 0–32)
AST: 19 IU/L (ref 0–40)
BUN / CREAT RATIO: 16 (ref 9–23)
BUN: 14 mg/dL (ref 6–24)
Bilirubin Total: 0.8 mg/dL (ref 0.0–1.2)
CO2: 25 mmol/L (ref 18–29)
Calcium: 9.6 mg/dL (ref 8.7–10.2)
Chloride: 101 mmol/L (ref 96–106)
Creatinine, Ser: 0.9 mg/dL (ref 0.57–1.00)
GFR calc Af Amer: 84 mL/min/{1.73_m2} (ref >59)
GFR calc non Af Amer: 73 mL/min/{1.73_m2} (ref >59)
GLOBULIN, TOTAL: 3.6 g/dL (ref 1.5–4.5)
Glucose: 84 mg/dL (ref 65–99)
POTASSIUM: 4.1 mmol/L (ref 3.5–5.2)
SODIUM: 140 mmol/L (ref 134–144)
Total Protein: 7.5 g/dL (ref 6.0–8.5)

## 2016-08-07 LAB — LIPID PANEL
CHOL/HDL RATIO: 3.6 ratio (ref 0.0–4.4)
Cholesterol, Total: 191 mg/dL (ref 100–199)
HDL: 53 mg/dL (ref >39)
LDL Calculated: 119 mg/dL — ABNORMAL HIGH (ref 0–99)
Triglycerides: 94 mg/dL (ref 0–149)
VLDL Cholesterol Cal: 19 mg/dL (ref 5–40)

## 2016-08-07 LAB — CBC WITH DIFFERENTIAL/PLATELET
BASOS: 0 %
Basophils Absolute: 0 10*3/uL (ref 0.0–0.2)
EOS (ABSOLUTE): 0.2 10*3/uL (ref 0.0–0.4)
Eos: 5 %
HEMATOCRIT: 39.7 % (ref 34.0–46.6)
HEMOGLOBIN: 13.4 g/dL (ref 11.1–15.9)
Immature Grans (Abs): 0 10*3/uL (ref 0.0–0.1)
Immature Granulocytes: 0 %
LYMPHS ABS: 1.5 10*3/uL (ref 0.7–3.1)
Lymphs: 35 %
MCH: 28.3 pg (ref 26.6–33.0)
MCHC: 33.8 g/dL (ref 31.5–35.7)
MCV: 84 fL (ref 79–97)
MONOS ABS: 0.4 10*3/uL (ref 0.1–0.9)
Monocytes: 10 %
NEUTROS ABS: 2.1 10*3/uL (ref 1.4–7.0)
NEUTROS PCT: 50 %
Platelets: 379 10*3/uL (ref 150–379)
RBC: 4.73 x10E6/uL (ref 3.77–5.28)
RDW: 15.8 % — AB (ref 12.3–15.4)
WBC: 4.3 10*3/uL (ref 3.4–10.8)

## 2016-08-07 LAB — HIV ANTIBODY (ROUTINE TESTING W REFLEX): HIV SCREEN 4TH GENERATION: NONREACTIVE

## 2016-08-07 LAB — HEPATITIS C ANTIBODY: Hep C Virus Ab: <0.1 s/co ratio (ref 0.0–0.9)

## 2017-07-12 ENCOUNTER — Other Ambulatory Visit: Payer: Self-pay | Admitting: Internal Medicine

## 2017-07-12 DIAGNOSIS — Z1231 Encounter for screening mammogram for malignant neoplasm of breast: Secondary | ICD-10-CM

## 2017-07-24 ENCOUNTER — Encounter: Payer: Self-pay | Admitting: Internal Medicine

## 2017-07-24 ENCOUNTER — Ambulatory Visit
Admission: RE | Admit: 2017-07-24 | Discharge: 2017-07-24 | Disposition: A | Discharge status: Home / Self Care | Payer: 59 | Source: Ambulatory Visit | Attending: Internal Medicine | Admitting: Internal Medicine

## 2017-07-24 ENCOUNTER — Ambulatory Visit (INDEPENDENT_AMBULATORY_CARE_PROVIDER_SITE_OTHER): Payer: 59 | Admitting: Internal Medicine

## 2017-07-24 VITALS — BP 118/84 | HR 79 | Ht 68.0 in | Wt 248.0 lb

## 2017-07-24 DIAGNOSIS — E782 Mixed hyperlipidemia: Secondary | ICD-10-CM | POA: Diagnosis not present

## 2017-07-24 DIAGNOSIS — Z23 Encounter for immunization: Secondary | ICD-10-CM | POA: Diagnosis not present

## 2017-07-24 DIAGNOSIS — B070 Plantar wart: Secondary | ICD-10-CM | POA: Diagnosis not present

## 2017-07-24 DIAGNOSIS — R928 Other abnormal and inconclusive findings on diagnostic imaging of breast: Secondary | ICD-10-CM | POA: Diagnosis not present

## 2017-07-24 DIAGNOSIS — I10 Essential (primary) hypertension: Secondary | ICD-10-CM | POA: Diagnosis not present

## 2017-07-24 DIAGNOSIS — B9681 Helicobacter pylori [H. pylori] as the cause of diseases classified elsewhere: Secondary | ICD-10-CM

## 2017-07-24 DIAGNOSIS — K2970 Gastritis, unspecified, without bleeding: Secondary | ICD-10-CM | POA: Diagnosis not present

## 2017-07-24 DIAGNOSIS — Z1231 Encounter for screening mammogram for malignant neoplasm of breast: Secondary | ICD-10-CM | POA: Diagnosis not present

## 2017-07-24 DIAGNOSIS — Z0000 Encounter for general adult medical examination without abnormal findings: Secondary | ICD-10-CM | POA: Diagnosis not present

## 2017-07-24 LAB — POCT URINALYSIS DIPSTICK
Bilirubin, UA: NEGATIVE
Glucose, UA: NEGATIVE
KETONES UA: NEGATIVE
Nitrite, UA: NEGATIVE
PH UA: 7 (ref 5.0–8.0)
RBC UA: NEGATIVE
SPEC GRAV UA: 1.01 (ref 1.010–1.025)
UROBILINOGEN UA: 0.2 U/dL

## 2017-07-24 MED ORDER — AMLODIPINE BESYLATE 5 MG PO TABS: 5.0000 mg | ORAL_TABLET | Freq: Every day | ORAL | 3 refills | Status: DC

## 2017-07-24 MED ORDER — LOSARTAN POTASSIUM-HCTZ 100-25 MG PO TABS: 1.0000 | ORAL_TABLET | Freq: Every day | ORAL | 3 refills | Status: DC

## 2017-07-24 NOTE — Patient Instructions (Signed)
Call insurance to see if Shingrix is covered.  DASH Eating Plan DASH stands for "Dietary Approaches to Stop Hypertension." The DASH eating plan is a healthy eating plan that has been shown to reduce high blood pressure (hypertension). It may also reduce your risk for type 2 diabetes, heart disease, and stroke. The DASH eating plan may also help with weight loss. What are tips for following this plan? General guidelines  Avoid eating more than 2,300 mg (milligrams) of salt (sodium) a day. If you have hypertension, you may need to reduce your sodium intake to 1,500 mg a day.  Limit alcohol intake to no more than 1 drink a day for nonpregnant women and 2 drinks a day for men. One drink equals 12 oz of beer, 5 oz of wine, or 1 oz of hard liquor.  Work with your health care provider to maintain a healthy body weight or to lose weight. Ask what an ideal weight is for you.  Get at least 30 minutes of exercise that causes your heart to beat faster (aerobic exercise) most days of the week. Activities may include walking, swimming, or biking.  Work with your health care provider or diet and nutrition specialist (dietitian) to adjust your eating plan to your individual calorie needs. Reading food labels  Check food labels for the amount of sodium per serving. Choose foods with less than 5 percent of the Daily Value of sodium. Generally, foods with less than 300 mg of sodium per serving fit into this eating plan.  To find whole grains, look for the word "whole" as the first word in the ingredient list. Shopping  Buy products labeled as "low-sodium" or "no salt added."  Buy fresh foods. Avoid canned foods and premade or frozen meals. Cooking  Avoid adding salt when cooking. Use salt-free seasonings or herbs instead of table salt or sea salt. Check with your health care provider or pharmacist before using salt substitutes.  Do not fry foods. Cook foods using healthy methods such as baking, boiling,  grilling, and broiling instead.  Cook with heart-healthy oils, such as olive, canola, soybean, or sunflower oil. Meal planning   Eat a balanced diet that includes: ? 5 or more servings of fruits and vegetables each day. At each meal, try to fill half of your plate with fruits and vegetables. ? Up to 6-8 servings of whole grains each day. ? Less than 6 oz of lean meat, poultry, or fish each day. A 3-oz serving of meat is about the same size as a deck of cards. One egg equals 1 oz. ? 2 servings of low-fat dairy each day. ? A serving of nuts, seeds, or beans 5 times each week. ? Heart-healthy fats. Healthy fats called Omega-3 fatty acids are found in foods such as flaxseeds and coldwater fish, like sardines, salmon, and mackerel.  Limit how much you eat of the following: ? Canned or prepackaged foods. ? Food that is high in trans fat, such as fried foods. ? Food that is high in saturated fat, such as fatty meat. ? Sweets, desserts, sugary drinks, and other foods with added sugar. ? Full-fat dairy products.  Do not salt foods before eating.  Try to eat at least 2 vegetarian meals each week.  Eat more home-cooked food and less restaurant, buffet, and fast food.  When eating at a restaurant, ask that your food be prepared with less salt or no salt, if possible. What foods are recommended? The items listed may not be  a complete list. Talk with your dietitian about what dietary choices are best for you. Grains Whole-grain or whole-wheat bread. Whole-grain or whole-wheat pasta. Brown rice. [place]. Bulgur. Whole-grain and low-sodium cereals. Pita bread. Low-fat, low-sodium crackers. Whole-wheat flour tortillas. Vegetables Fresh or frozen vegetables (raw, steamed, roasted, or grilled). Low-sodium or reduced-sodium tomato and vegetable juice. Low-sodium or reduced-sodium tomato sauce and tomato paste. Low-sodium or reduced-sodium canned vegetables. Fruits All fresh, dried, or frozen  fruit. Canned fruit in natural juice (without added sugar). Meat and other protein foods Skinless chicken or [place]. Ground chicken or [place]. Pork with fat trimmed off. Fish and seafood. Egg whites. Dried beans, peas, or lentils. Unsalted nuts, nut butters, and seeds. Unsalted canned beans. Lean cuts of beef with fat trimmed off. Low-sodium, lean deli meat. Dairy Low-fat (1%) or fat-free (skim) milk. Fat-free, low-fat, or reduced-fat cheeses. Nonfat, low-sodium ricotta or cottage cheese. Low-fat or nonfat yogurt. Low-fat, low-sodium cheese. Fats and oils Soft margarine without trans fats. Vegetable oil. Low-fat, reduced-fat, or light mayonnaise and salad dressings (reduced-sodium). Canola, safflower, olive, soybean, and sunflower oils. Avocado. Seasoning and other foods Herbs. Spices. Seasoning mixes without salt. Unsalted popcorn and pretzels. Fat-free sweets. What foods are not recommended? The items listed may not be a complete list. Talk with your dietitian about what dietary choices are best for you. Grains Baked goods made with fat, such as croissants, muffins, or some breads. Dry pasta or rice meal packs. Vegetables Creamed or fried vegetables. Vegetables in a cheese sauce. Regular canned vegetables (not low-sodium or reduced-sodium). Regular canned tomato sauce and paste (not low-sodium or reduced-sodium). Regular tomato and vegetable juice (not low-sodium or reduced-sodium). Angie Fava. Olives. Fruits Canned fruit in a light or heavy syrup. Fried fruit. Fruit in cream or butter sauce. Meat and other protein foods Fatty cuts of meat. Ribs. Fried meat. Berniece Salines. Sausage. Bologna and other processed lunch meats. Salami. Fatback. Hotdogs. Bratwurst. Salted nuts and seeds. Canned beans with added salt. Canned or smoked fish. Whole eggs or egg yolks. Chicken or [place] with skin. Dairy Whole or 2% milk, cream, and half-and-half. Whole or full-fat cream cheese. Whole-fat or sweetened yogurt. Full-fat  cheese. Nondairy creamers. Whipped toppings. Processed cheese and cheese spreads. Fats and oils Butter. Stick margarine. Lard. Shortening. Ghee. Bacon fat. Tropical oils, such as coconut, palm kernel, or palm oil. Seasoning and other foods Salted popcorn and pretzels. Onion salt, garlic salt, seasoned salt, table salt, and sea salt. Worcestershire sauce. Tartar sauce. Barbecue sauce. Teriyaki sauce. Soy sauce, including reduced-sodium. Steak sauce. Canned and packaged gravies. Fish sauce. Oyster sauce. Cocktail sauce. Horseradish that you find on the shelf. Ketchup. Mustard. Meat flavorings and tenderizers. Bouillon cubes. Hot sauce and Tabasco sauce. Premade or packaged marinades. Premade or packaged taco seasonings. Relishes. Regular salad dressings. Where to find more information:  National Heart, Lung, and [place]: https://wilson-eaton.com/  American Heart Association: www.heart.org Summary  The DASH eating plan is a healthy eating plan that has been shown to reduce high blood pressure (hypertension). It may also reduce your risk for type 2 diabetes, heart disease, and stroke.  With the DASH eating plan, you should limit salt (sodium) intake to 2,300 mg a day. If you have hypertension, you may need to reduce your sodium intake to 1,500 mg a day.  When on the DASH eating plan, aim to eat more fresh fruits and vegetables, whole grains, lean proteins, low-fat dairy, and heart-healthy fats.  Work with your health care provider or diet and nutrition specialist (  dietitian) to adjust your eating plan to your individual calorie needs. This information is not intended to replace advice given to you by your health care provider. Make sure you discuss any questions you have with your health care provider. Document Released: 10/04/2011 Document Revised: 10/08/2016 Document Reviewed: 10/08/2016 Elsevier Interactive Patient Education  2017 [employer].

## 2017-07-24 NOTE — Progress Notes (Signed)
Date:  07/24/2017   Name:  Cheryl Mosley   DOB:  1961/10/25   MRN:  [id number]   Chief Complaint: Annual Exam and Immunizations (Flu Vaccine ) Cheryl Mosley is a 56 y.o. female who presents today for her Complete Annual Exam. She feels well. She reports exercising some. She reports she is sleeping well. She denies breast problems - mammogram was just done this morning.  Colonoscopy was done in 2014 and was normal.  Pap in no longer needed due to hysterectomy.  Hypertension  This is a chronic problem. The problem is controlled. Pertinent negatives include no chest pain, headaches, palpitations or shortness of breath. Past treatments include angiotensin blockers, diuretics and calcium channel blockers. The current treatment provides significant improvement. There are no compliance problems.    Hx Gastritis - EGD with mild gastritis and H Pylori in 2016.  Treated with antibiotics and PPI.  No longer needs medication - may occasionally take an antacid.  Review of Systems  Constitutional: Negative for chills, fatigue and fever.  HENT: Negative for congestion, hearing loss, tinnitus, trouble swallowing and voice change.   Eyes: Negative for visual disturbance.  Respiratory: Negative for cough, chest tightness, shortness of breath and wheezing.   Cardiovascular: Negative for chest pain, palpitations and leg swelling.  Gastrointestinal: Negative for abdominal pain, constipation, diarrhea and vomiting.  Endocrine: Negative for polydipsia and polyuria.  Genitourinary: Negative for dysuria, frequency, genital sores, vaginal bleeding and vaginal discharge.  Musculoskeletal: Negative for arthralgias, gait problem and joint swelling.  Skin: Negative for color change and rash.       Plantar wart on ball of right foot  Neurological: Negative for dizziness, tremors, light-headedness and headaches.  Hematological: Negative for adenopathy. Does not bruise/bleed easily.  Psychiatric/Behavioral:  Negative for dysphoric mood and sleep disturbance. The patient is not nervous/anxious.     Patient Active Problem List   Diagnosis Date Noted  . Heartburn   . Other specified diseases of esophagus   . Gastritis   . Essential (primary) hypertension 06/16/2015  . Combined fat and carbohydrate induced hyperlipemia 06/16/2015  . 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 Helicobacter pylori gastritis 06/14/2015    Prior to Admission medications   Medication Sig Start Date End Date Taking? Authorizing Provider  amLODipine (NORVASC) 5 MG tablet Take 1 tablet (5 mg total) by mouth daily. 08/06/16  Yes Reubin Milan, MD  losartan-hydrochlorothiazide (HYZAAR) 100-25 MG tablet Take 1 tablet by mouth daily. 08/06/16  Yes Reubin Milan, MD    No Known Allergies  Past Surgical History:  Procedure Laterality Date  . CHOLECYSTECTOMY N/A 10/27/2015   Procedure: LAPAROSCOPIC CHOLECYSTECTOMY WITH INTRAOPERATIVE CHOLANGIOGRAM;  Surgeon: Ricarda Frame, MD;  Location: [facility];  Service: General;  Laterality: N/A;  . COLONOSCOPY  2015   normal- cleared for 10 yrs- Dr Servando Snare  . ESOPHAGOGASTRODUODENOSCOPY (EGD) WITH PROPOFOL N/A 09/19/2015   Procedure: ESOPHAGOGASTRODUODENOSCOPY (EGD) WITH PROPOFOL;  Surgeon: Midge Minium, MD;  Location: [facility];  Service: Endoscopy;  Laterality: N/A;  . VAGINAL HYSTERECTOMY  2005   partial    Social History  Substance Use Topics  . Smoking status: Never Smoker  . Smokeless tobacco: Never Used  . Alcohol use No     Medication list has been reviewed and updated.  PHQ 2/9 Scores 07/24/2017 05/11/2016  PHQ - 2 Score 0 0    Physical Exam  Constitutional: She is oriented to person, place, and time. She appears well-developed and well-nourished. No  distress.  HENT:  Head: Normocephalic and atraumatic.  Right Ear: Tympanic membrane and ear canal normal.  Left Ear: Tympanic membrane and ear canal normal.  Nose: Right sinus exhibits no  maxillary sinus tenderness. Left sinus exhibits no maxillary sinus tenderness.  Mouth/Throat: Uvula is midline and oropharynx is clear and moist.  Eyes: Conjunctivae and EOM are normal. Right eye exhibits no discharge. Left eye exhibits no discharge. No scleral icterus.  Neck: Normal range of motion. Carotid bruit is not present. No erythema present. No thyromegaly present.  Cardiovascular: Normal rate, regular rhythm, normal heart sounds and normal pulses.   Pulmonary/Chest: Effort normal. No respiratory distress. She has no wheezes.  Abdominal: Soft. Bowel sounds are normal. There is no hepatosplenomegaly. There is no tenderness. There is no CVA tenderness.  Musculoskeletal: Normal range of motion.       Right knee: She exhibits normal range of motion, no swelling and no effusion.       Left knee: She exhibits normal range of motion (mild crepitus on ROM), no swelling and no effusion.       Feet:  Lymphadenopathy:    She has no cervical adenopathy.    She has no axillary adenopathy.  Neurological: She is alert and oriented to person, place, and time. She has normal reflexes. No cranial nerve deficit or sensory deficit.  Skin: Skin is warm, dry and intact. No rash noted.  Psychiatric: She has a normal mood and affect. Her speech is normal and behavior is normal. Thought content normal.  Nursing note and vitals reviewed.   BP 118/84 (BP Location: Right Arm, Patient Position: Sitting, Cuff Size: Large)   Pulse 79   Ht  (1.727 m)   Wt 248 lb (112.5 kg)   SpO2 99%   BMI 37.71 kg/m   Assessment and Plan: 1. Annual physical exam Normal exam except for weight - need to begin regular weight bearing exercise 4-5 times per week - POCT urinalysis dipstick  2. Essential (primary) hypertension controlled - losartan-hydrochlorothiazide (HYZAAR) 100-25 MG tablet; Take 1 tablet by mouth daily.  Dispense: 90 tablet; Refill: 3 - amLODipine (NORVASC) 5 MG tablet; Take 1 tablet (5 mg total) by  mouth daily.  Dispense: 90 tablet; Refill: 3 - CBC with Differential/Platelet - Comprehensive metabolic panel - TSH  3. Hyperlipidemia, mixed Will advise if medication is needed - Lipid panel  4. Helicobacter pylori gastritis Resolved with treatment 2016 - CBC with Differential/Platelet  5. Need for influenza vaccination - Flu Vaccine QUAD 36+ mos IM  6. Plantar wart of right foot - Ambulatory referral to Podiatry   Meds ordered this encounter  Medications  . losartan-hydrochlorothiazide (HYZAAR) 100-25 MG tablet    Sig: Take 1 tablet by mouth daily.    Dispense:  90 tablet    Refill:  3  . amLODipine (NORVASC) 5 MG tablet    Sig: Take 1 tablet (5 mg total) by mouth daily.    Dispense:  90 tablet    Refill:  3    Partially dictated using Animal nutritionist. Any errors are unintentional.  [name], MD [facility]  07/24/2017

## 2017-07-25 ENCOUNTER — Other Ambulatory Visit: Payer: Self-pay | Admitting: Internal Medicine

## 2017-07-25 DIAGNOSIS — N6310 Unspecified lump in the right breast, unspecified quadrant: Secondary | ICD-10-CM

## 2017-07-25 DIAGNOSIS — R928 Other abnormal and inconclusive findings on diagnostic imaging of breast: Secondary | ICD-10-CM

## 2017-07-25 LAB — CBC WITH DIFFERENTIAL/PLATELET
Basophils Absolute: 0 10*3/uL (ref 0.0–0.2)
Basos: 0 %
EOS (ABSOLUTE): 0.1 10*3/uL (ref 0.0–0.4)
EOS: 3 %
HEMATOCRIT: 40.1 % (ref 34.0–46.6)
HEMOGLOBIN: 12.9 g/dL (ref 11.1–15.9)
IMMATURE GRANS (ABS): 0 10*3/uL (ref 0.0–0.1)
IMMATURE GRANULOCYTES: 0 %
Lymphocytes Absolute: 1.7 10*3/uL (ref 0.7–3.1)
Lymphs: 42 %
MCH: 27.9 pg (ref 26.6–33.0)
MCHC: 32.2 g/dL (ref 31.5–35.7)
MCV: 87 fL (ref 79–97)
MONOCYTES: 11 %
Monocytes Absolute: 0.4 10*3/uL (ref 0.1–0.9)
NEUTROS PCT: 44 %
Neutrophils Absolute: 1.7 10*3/uL (ref 1.4–7.0)
Platelets: 282 10*3/uL (ref 150–379)
RBC: 4.63 x10E6/uL (ref 3.77–5.28)
RDW: 15.6 % — ABNORMAL HIGH (ref 12.3–15.4)
WBC: 4 10*3/uL (ref 3.4–10.8)

## 2017-07-25 LAB — COMPREHENSIVE METABOLIC PANEL
ALBUMIN: 4 g/dL (ref 3.5–5.5)
ALT: 13 IU/L (ref 0–32)
AST: 18 IU/L (ref 0–40)
Albumin/Globulin Ratio: 1.2 (ref 1.2–2.2)
Alkaline Phosphatase: 88 IU/L (ref 39–117)
BUN / CREAT RATIO: 15 (ref 9–23)
BUN: 13 mg/dL (ref 6–24)
Bilirubin Total: 0.7 mg/dL (ref 0.0–1.2)
CALCIUM: 9.5 mg/dL (ref 8.7–10.2)
CO2: 28 mmol/L (ref 20–29)
CREATININE: 0.85 mg/dL (ref 0.57–1.00)
Chloride: 99 mmol/L (ref 96–106)
GFR calc Af Amer: 89 mL/min/{1.73_m2} (ref >59)
GFR, EST NON AFRICAN AMERICAN: 77 mL/min/{1.73_m2} (ref >59)
GLOBULIN, TOTAL: 3.3 g/dL (ref 1.5–4.5)
Glucose: 61 mg/dL — ABNORMAL LOW (ref 65–99)
Potassium: 4.2 mmol/L (ref 3.5–5.2)
SODIUM: 141 mmol/L (ref 134–144)
TOTAL PROTEIN: 7.3 g/dL (ref 6.0–8.5)

## 2017-07-25 LAB — LIPID PANEL
Chol/HDL Ratio: 3.5 ratio (ref 0.0–4.4)
Cholesterol, Total: 198 mg/dL (ref 100–199)
HDL: 56 mg/dL (ref >39)
LDL Calculated: 112 mg/dL — ABNORMAL HIGH (ref 0–99)
TRIGLYCERIDES: 151 mg/dL — AB (ref 0–149)
VLDL Cholesterol Cal: 30 mg/dL (ref 5–40)

## 2017-07-25 LAB — TSH: TSH: 2.59 u[IU]/mL (ref 0.450–4.500)

## 2017-08-15 ENCOUNTER — Ambulatory Visit
Admission: RE | Admit: 2017-08-15 | Discharge: 2017-08-15 | Disposition: A | Discharge status: Home / Self Care | Payer: 59 | Source: Ambulatory Visit | Attending: Internal Medicine | Admitting: Internal Medicine

## 2017-08-15 DIAGNOSIS — N6310 Unspecified lump in the right breast, unspecified quadrant: Secondary | ICD-10-CM

## 2017-08-15 DIAGNOSIS — R928 Other abnormal and inconclusive findings on diagnostic imaging of breast: Secondary | ICD-10-CM

## 2017-08-15 DIAGNOSIS — N6311 Unspecified lump in the right breast, upper outer quadrant: Secondary | ICD-10-CM | POA: Insufficient documentation

## 2017-12-30 ENCOUNTER — Telehealth: Payer: Self-pay

## 2017-12-30 NOTE — Telephone Encounter (Signed)
Patient informed she will need to stay on the medication. Informed her I called [facility] [place]  And confirmed they have not been recalled for ANY losartan medications at this time. She verbalized understanding.

## 2017-12-30 NOTE — Telephone Encounter (Signed)
She will have to check on the lot number.  There are limited medications available in this class and as long as she is not taking a recalled lot, she will need to stay on the medication.

## 2017-12-30 NOTE — Telephone Encounter (Signed)
Patient called about recall on losartan medication. This came out on the news this morning. Informed her she needs to contact her pharmacy about the recall to verify this. Informed her its usually only certain medication lot numbers and not all the medications. She still stated she does not want to contact her pharmacy and she is not comfortable with taking this medication or any of them that has been recalled.

## 2018-01-23 ENCOUNTER — Ambulatory Visit: Payer: 59 | Admitting: Internal Medicine

## 2018-02-06 ENCOUNTER — Encounter: Payer: Self-pay | Admitting: Internal Medicine

## 2018-02-06 ENCOUNTER — Ambulatory Visit (INDEPENDENT_AMBULATORY_CARE_PROVIDER_SITE_OTHER): Payer: 59 | Admitting: Internal Medicine

## 2018-02-06 VITALS — BP 134/86 | HR 79 | Ht 68.0 in | Wt 253.0 lb

## 2018-02-06 DIAGNOSIS — Z8719 Personal history of other diseases of the digestive system: Secondary | ICD-10-CM

## 2018-02-06 DIAGNOSIS — N6001 Solitary cyst of right breast: Secondary | ICD-10-CM | POA: Diagnosis not present

## 2018-02-06 DIAGNOSIS — I10 Essential (primary) hypertension: Secondary | ICD-10-CM

## 2018-02-06 NOTE — Patient Instructions (Signed)
DASH Eating Plan DASH stands for "Dietary Approaches to Stop Hypertension." The DASH eating plan is a healthy eating plan that has been shown to reduce high blood pressure (hypertension). It may also reduce your risk for type 2 diabetes, heart disease, and stroke. The DASH eating plan may also help with weight loss. What are tips for following this plan? General guidelines  Avoid eating more than 2,300 mg (milligrams) of salt (sodium) a day. If you have hypertension, you may need to reduce your sodium intake to 1,500 mg a day.  Limit alcohol intake to no more than 1 drink a day for nonpregnant women and 2 drinks a day for men. One drink equals 12 oz of beer, 5 oz of wine, or 1 oz of hard liquor.  Work with your health care provider to maintain a healthy body weight or to lose weight. Ask what an ideal weight is for you.  Get at least 30 minutes of exercise that causes your heart to beat faster (aerobic exercise) most days of the week. Activities may include walking, swimming, or biking.  Work with your health care provider or diet and nutrition specialist (dietitian) to adjust your eating plan to your individual calorie needs. Reading food labels  Check food labels for the amount of sodium per serving. Choose foods with less than 5 percent of the Daily Value of sodium. Generally, foods with less than 300 mg of sodium per serving fit into this eating plan.  To find whole grains, look for the word "whole" as the first word in the ingredient list. Shopping  Buy products labeled as "low-sodium" or "no salt added."  Buy fresh foods. Avoid canned foods and premade or frozen meals. Cooking  Avoid adding salt when cooking. Use salt-free seasonings or herbs instead of table salt or sea salt. Check with your health care provider or pharmacist before using salt substitutes.  Do not fry foods. Cook foods using healthy methods such as baking, boiling, grilling, and broiling instead.  Cook with  heart-healthy oils, such as olive, canola, soybean, or sunflower oil. Meal planning   Eat a balanced diet that includes: ? 5 or more servings of fruits and vegetables each day. At each meal, try to fill half of your plate with fruits and vegetables. ? Up to 6-8 servings of whole grains each day. ? Less than 6 oz of lean meat, poultry, or fish each day. A 3-oz serving of meat is about the same size as a deck of cards. One egg equals 1 oz. ? 2 servings of low-fat dairy each day. ? A serving of nuts, seeds, or beans 5 times each week. ? Heart-healthy fats. Healthy fats called Omega-3 fatty acids are found in foods such as flaxseeds and coldwater fish, like sardines, salmon, and mackerel.  Limit how much you eat of the following: ? Canned or prepackaged foods. ? Food that is high in trans fat, such as fried foods. ? Food that is high in saturated fat, such as fatty meat. ? Sweets, desserts, sugary drinks, and other foods with added sugar. ? Full-fat dairy products.  Do not salt foods before eating.  Try to eat at least 2 vegetarian meals each week.  Eat more home-cooked food and less restaurant, buffet, and fast food.  When eating at a restaurant, ask that your food be prepared with less salt or no salt, if possible. What foods are recommended? The items listed may not be a complete list. Talk with your dietitian about what   dietary choices are best for you. Grains Whole-grain or whole-wheat bread. Whole-grain or whole-wheat pasta. Brown rice. Oatmeal. Quinoa. Bulgur. Whole-grain and low-sodium cereals. Pita bread. Low-fat, low-sodium crackers. Whole-wheat flour tortillas. Vegetables Fresh or frozen vegetables (raw, steamed, roasted, or grilled). Low-sodium or reduced-sodium tomato and vegetable juice. Low-sodium or reduced-sodium tomato sauce and tomato paste. Low-sodium or reduced-sodium canned vegetables. Fruits All fresh, dried, or frozen fruit. Canned fruit in natural juice (without  added sugar). Meat and other protein foods Skinless chicken or turkey. Ground chicken or turkey. Pork with fat trimmed off. Fish and seafood. Egg whites. Dried beans, peas, or lentils. Unsalted nuts, nut butters, and seeds. Unsalted canned beans. Lean cuts of beef with fat trimmed off. Low-sodium, lean deli meat. Dairy Low-fat (1%) or fat-free (skim) milk. Fat-free, low-fat, or reduced-fat cheeses. Nonfat, low-sodium ricotta or cottage cheese. Low-fat or nonfat yogurt. Low-fat, low-sodium cheese. Fats and oils Soft margarine without trans fats. Vegetable oil. Low-fat, reduced-fat, or light mayonnaise and salad dressings (reduced-sodium). Canola, safflower, olive, soybean, and sunflower oils. Avocado. Seasoning and other foods Herbs. Spices. Seasoning mixes without salt. Unsalted popcorn and pretzels. Fat-free sweets. What foods are not recommended? The items listed may not be a complete list. Talk with your dietitian about what dietary choices are best for you. Grains Baked goods made with fat, such as croissants, muffins, or some breads. Dry pasta or rice meal packs. Vegetables Creamed or fried vegetables. Vegetables in a cheese sauce. Regular canned vegetables (not low-sodium or reduced-sodium). Regular canned tomato sauce and paste (not low-sodium or reduced-sodium). Regular tomato and vegetable juice (not low-sodium or reduced-sodium). Pickles. Olives. Fruits Canned fruit in a light or heavy syrup. Fried fruit. Fruit in cream or butter sauce. Meat and other protein foods Fatty cuts of meat. Ribs. Fried meat. Bacon. Sausage. Bologna and other processed lunch meats. Salami. Fatback. Hotdogs. Bratwurst. Salted nuts and seeds. Canned beans with added salt. Canned or smoked fish. Whole eggs or egg yolks. Chicken or turkey with skin. Dairy Whole or 2% milk, cream, and half-and-half. Whole or full-fat cream cheese. Whole-fat or sweetened yogurt. Full-fat cheese. Nondairy creamers. Whipped toppings.  Processed cheese and cheese spreads. Fats and oils Butter. Stick margarine. Lard. Shortening. Ghee. Bacon fat. Tropical oils, such as coconut, palm kernel, or palm oil. Seasoning and other foods Salted popcorn and pretzels. Onion salt, garlic salt, seasoned salt, table salt, and sea salt. Worcestershire sauce. Tartar sauce. Barbecue sauce. Teriyaki sauce. Soy sauce, including reduced-sodium. Steak sauce. Canned and packaged gravies. Fish sauce. Oyster sauce. Cocktail sauce. Horseradish that you find on the shelf. Ketchup. Mustard. Meat flavorings and tenderizers. Bouillon cubes. Hot sauce and Tabasco sauce. Premade or packaged marinades. Premade or packaged taco seasonings. Relishes. Regular salad dressings. Where to find more information:  National Heart, Lung, and Blood Institute: www.nhlbi.nih.gov  American Heart Association: www.heart.org Summary  The DASH eating plan is a healthy eating plan that has been shown to reduce high blood pressure (hypertension). It may also reduce your risk for type 2 diabetes, heart disease, and stroke.  With the DASH eating plan, you should limit salt (sodium) intake to 2,300 mg a day. If you have hypertension, you may need to reduce your sodium intake to 1,500 mg a day.  When on the DASH eating plan, aim to eat more fresh fruits and vegetables, whole grains, lean proteins, low-fat dairy, and heart-healthy fats.  Work with your health care provider or diet and nutrition specialist (dietitian) to adjust your eating plan to your individual   calorie needs. This information is not intended to replace advice given to you by your health care provider. Make sure you discuss any questions you have with your health care provider. Document Released: 10/04/2011 Document Revised: 10/08/2016 Document Reviewed: 10/08/2016 Elsevier Interactive Patient Education  2018 Elsevier Inc.  

## 2018-02-06 NOTE — Progress Notes (Signed)
Date:  02/06/2018   Name:  Cheryl Mosley   DOB:  [birth year]   MRN:  [id number]   Chief Complaint: Hypertension Hypertension  This is a chronic problem. The problem is controlled. Pertinent negatives include no chest pain, headaches, palpitations or shortness of breath. Past treatments include calcium channel blockers, angiotensin blockers and diuretics. The current treatment provides significant improvement.    BP Readings from Last 3 Encounters:  02/06/18 134/86  07/24/17 118/84  08/06/16 118/82     Review of Systems  Constitutional: Negative for appetite change, fatigue, fever and unexpected weight change.  HENT: Negative for tinnitus and trouble swallowing.   Eyes: Negative for visual disturbance.  Respiratory: Negative for cough, chest tightness and shortness of breath.   Cardiovascular: Negative for chest pain, palpitations and leg swelling.  Gastrointestinal: Negative for abdominal pain and constipation.  Genitourinary: Negative for dysuria and hematuria.  Musculoskeletal: Negative for arthralgias.  Neurological: Negative for tremors, numbness and headaches.  Psychiatric/Behavioral: Negative for dysphoric mood.    Patient Active Problem List   Diagnosis Date Noted  . Essential (primary) hypertension 06/16/2015  . Hyperlipidemia, mixed 06/16/2015  . 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 Hx of gastritis 06/14/2015    Prior to Admission medications   Medication Sig Start Date End Date Taking? Authorizing Provider  amLODipine (NORVASC) 5 MG tablet Take 1 tablet (5 mg total) by mouth daily. 07/24/17   Reubin Milan, MD  losartan-hydrochlorothiazide (HYZAAR) 100-25 MG tablet Take 1 tablet by mouth daily. 07/24/17   Reubin Milan, MD    No Known Allergies  Past Surgical History:  Procedure Laterality Date  . CHOLECYSTECTOMY N/A 10/27/2015   Procedure: LAPAROSCOPIC CHOLECYSTECTOMY WITH INTRAOPERATIVE CHOLANGIOGRAM;  Surgeon: Ricarda Frame,  MD;  Location: [facility];  Service: General;  Laterality: N/A;  . COLONOSCOPY  2015   normal- cleared for 10 yrs- Dr Servando Snare  . ESOPHAGOGASTRODUODENOSCOPY (EGD) WITH PROPOFOL N/A 09/19/2015   Procedure: ESOPHAGOGASTRODUODENOSCOPY (EGD) WITH PROPOFOL;  Surgeon: Midge Minium, MD;  Location: [facility];  Service: Endoscopy;  Laterality: N/A;  . VAGINAL HYSTERECTOMY  2005   partial    Social History   Tobacco Use  . Smoking status: Never Smoker  . Smokeless tobacco: Never Used  Substance Use Topics  . Alcohol use: No    Alcohol/week: 0.0 oz  . Drug use: No     Medication list has been reviewed and updated.  PHQ 2/9 Scores 07/24/2017 05/11/2016  PHQ - 2 Score 0 0    Physical Exam  Constitutional: She is oriented to person, place, and time. She appears well-developed. No distress.  HENT:  Head: Normocephalic and atraumatic.  Neck: Normal range of motion. Neck supple.  Cardiovascular: Normal rate, regular rhythm and normal heart sounds.  Pulmonary/Chest: Effort normal and breath sounds normal. No respiratory distress.  Musculoskeletal: She exhibits no edema or tenderness.  Neurological: She is alert and oriented to person, place, and time.  Skin: Skin is warm and dry. No rash noted.  Psychiatric: She has a normal mood and affect. Her behavior is normal. Thought content normal.  Nursing note and vitals reviewed.   BP 134/86   Pulse 79   Ht 5\' 8"  (1.727 m)   Wt 253 lb (114.8 kg)   SpO2 100%   BMI 38.47 kg/m   Assessment and Plan: 1. Essential (primary) hypertension Well controlled on current medications Encourage regular exercise Continue healthy diet  2. Hx of gastritis No current symptoms on no  medications   No orders of the defined types were placed in this encounter.   Partially dictated using Animal nutritionistDragon software. Any errors are unintentional.  Bari EdwardLaura Nikki Glanzer, MD [facility]  02/06/2018

## 2018-07-25 ENCOUNTER — Encounter: Payer: Self-pay | Admitting: Internal Medicine

## 2018-07-25 ENCOUNTER — Ambulatory Visit (INDEPENDENT_AMBULATORY_CARE_PROVIDER_SITE_OTHER): Payer: 59 | Admitting: Internal Medicine

## 2018-07-25 VITALS — BP 132/78 | HR 73 | Ht 68.0 in | Wt 252.0 lb

## 2018-07-25 DIAGNOSIS — Z1239 Encounter for other screening for malignant neoplasm of breast: Secondary | ICD-10-CM

## 2018-07-25 DIAGNOSIS — I10 Essential (primary) hypertension: Secondary | ICD-10-CM

## 2018-07-25 DIAGNOSIS — E782 Mixed hyperlipidemia: Secondary | ICD-10-CM

## 2018-07-25 DIAGNOSIS — Z0000 Encounter for general adult medical examination without abnormal findings: Secondary | ICD-10-CM | POA: Diagnosis not present

## 2018-07-25 DIAGNOSIS — Z6838 Body mass index (BMI) 38.0-38.9, adult: Secondary | ICD-10-CM | POA: Diagnosis not present

## 2018-07-25 DIAGNOSIS — Z23 Encounter for immunization: Secondary | ICD-10-CM | POA: Diagnosis not present

## 2018-07-25 DIAGNOSIS — Z6836 Body mass index (BMI) 36.0-36.9, adult: Secondary | ICD-10-CM | POA: Insufficient documentation

## 2018-07-25 DIAGNOSIS — Z1231 Encounter for screening mammogram for malignant neoplasm of breast: Secondary | ICD-10-CM

## 2018-07-25 LAB — POCT URINALYSIS DIPSTICK
Bilirubin, UA: NEGATIVE
Glucose, UA: NEGATIVE
Ketones, UA: NEGATIVE
NITRITE UA: NEGATIVE
PROTEIN UA: NEGATIVE
Spec Grav, UA: 1.015 (ref 1.010–1.025)
Urobilinogen, UA: 0.2 E.U./dL
pH, UA: 6 (ref 5.0–8.0)

## 2018-07-25 MED ORDER — AMLODIPINE BESYLATE 5 MG PO TABS: 5.0000 mg | ORAL_TABLET | Freq: Every day | ORAL | 3 refills | Status: DC

## 2018-07-25 MED ORDER — LOSARTAN POTASSIUM-HCTZ 100-25 MG PO TABS: 1.0000 | ORAL_TABLET | Freq: Every day | ORAL | 3 refills | Status: DC

## 2018-07-25 NOTE — Progress Notes (Signed)
Date:  07/25/2018   Name:  Cheryl Mosley   DOB:  [birth year]   MRN:  [id number]   Chief Complaint: Annual Exam (Breast Exam. Flu Shot.) Cheryl Mosley is a 57 y.o. female who presents today for her Complete Annual Exam. She feels well. She reports exercising very little but walks frequently on her job. She reports she is sleeping well.  She is due for a mammogram - denies breast complaints. Colonoscopy was done in 2014. She wants to lose some weight and asks about intermittent fasting.  Hypertension  This is a chronic problem. The problem is controlled. Pertinent negatives include no chest pain, headaches, palpitations or shortness of breath. Past treatments include calcium channel blockers, angiotensin blockers and diuretics. There are no compliance problems.     Review of Systems  Constitutional: Negative for chills, fatigue and fever.  HENT: Negative for congestion, hearing loss, tinnitus, trouble swallowing and voice change.   Eyes: Negative for visual disturbance.  Respiratory: Negative for cough, chest tightness, shortness of breath and wheezing.   Cardiovascular: Negative for chest pain, palpitations and leg swelling.  Gastrointestinal: Negative for abdominal pain, constipation, diarrhea and vomiting.  Endocrine: Negative for polydipsia and polyuria.  Genitourinary: Negative for dysuria, frequency, genital sores, vaginal bleeding and vaginal discharge.  Musculoskeletal: Negative for arthralgias, gait problem and joint swelling.  Skin: Negative for color change and rash.  Neurological: Negative for dizziness, tremors, light-headedness and headaches.  Hematological: Negative for adenopathy. Does not bruise/bleed easily.  Psychiatric/Behavioral: Negative for dysphoric mood and sleep disturbance. The patient is not nervous/anxious.     Patient Active Problem List   Diagnosis Date Noted  . Cyst of right breast 02/06/2018  . Essential (primary) hypertension 06/16/2015  .  Hyperlipidemia, mixed 06/16/2015  . 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 Hx of gastritis 06/14/2015    No Known Allergies  Past Surgical History:  Procedure Laterality Date  . CHOLECYSTECTOMY N/A 10/27/2015   Procedure: LAPAROSCOPIC CHOLECYSTECTOMY WITH INTRAOPERATIVE CHOLANGIOGRAM;  Surgeon: Ricarda Frame, MD;  Location: [facility] ORS;  Service: General;  Laterality: N/A;  . COLONOSCOPY  2015   normal- cleared for 10 yrs- Dr Servando Snare  . ESOPHAGOGASTRODUODENOSCOPY (EGD) WITH PROPOFOL N/A 09/19/2015   Procedure: ESOPHAGOGASTRODUODENOSCOPY (EGD) WITH PROPOFOL;  Surgeon: Midge Minium, MD;  Location: [facility];  Service: Endoscopy;  Laterality: N/A;  . VAGINAL HYSTERECTOMY  2005   partial    Social History   Tobacco Use  . Smoking status: Never Smoker  . Smokeless tobacco: Never Used  Substance Use Topics  . Alcohol use: No    Alcohol/week: 0.0 standard drinks  . Drug use: No     Medication list has been reviewed and updated.  Current Meds  Medication Sig  . amLODipine (NORVASC) 5 MG tablet Take 1 tablet (5 mg total) by mouth daily.  [name] losartan-hydrochlorothiazide (HYZAAR) 100-25 MG tablet Take 1 tablet by mouth daily.    PHQ 2/9 Scores 07/25/2018 07/24/2017 05/11/2016  PHQ - 2 Score 0 0 0    Physical Exam  Constitutional: She is oriented to person, place, and time. She appears well-developed and well-nourished. No distress.  HENT:  Head: Normocephalic and atraumatic.  Right Ear: Tympanic membrane and ear canal normal.  Left Ear: Tympanic membrane and ear canal normal.  Nose: Right sinus exhibits no maxillary sinus tenderness. Left sinus exhibits no maxillary sinus tenderness.  Mouth/Throat: Uvula is midline and oropharynx is clear and moist.  Eyes: Conjunctivae and EOM are normal. Right  eye exhibits no discharge. Left eye exhibits no discharge. No scleral icterus.  Neck: Normal range of motion. Carotid bruit is not present. No erythema present. No  thyromegaly present.  Cardiovascular: Normal rate, regular rhythm, normal heart sounds and normal pulses.  Pulmonary/Chest: Effort normal. No respiratory distress. She has no wheezes. Right breast exhibits no mass, no nipple discharge, no skin change and no tenderness. Left breast exhibits no mass, no nipple discharge, no skin change and no tenderness.  Abdominal: Soft. Bowel sounds are normal. There is no hepatosplenomegaly. There is no tenderness. There is no CVA tenderness.  Musculoskeletal: Normal range of motion.  Lymphadenopathy:    She has no cervical adenopathy.    She has no axillary adenopathy.  Neurological: She is alert and oriented to person, place, and time. She has normal reflexes. No cranial nerve deficit or sensory deficit.  Skin: Skin is warm, dry and intact. No rash noted.  Psychiatric: She has a normal mood and affect. Her speech is normal and behavior is normal. Thought content normal.  Nursing note and vitals reviewed.   BP 132/78 (BP Location: Right Arm, Patient Position: Sitting, Cuff Size: Large)   Pulse 73   Ht 5\' 8"  (1.727 m)   Wt 252 lb (114.3 kg)   SpO2 99%   BMI 38.32 kg/m   Assessment and Plan: 1. Annual physical exam Try Intermittent fasting and no food within 4 hours of bedtime Begin regular exercise - POCT urinalysis dipstick  2. Breast cancer screening Schedule at [facility] - MM 3D SCREEN BREAST BILATERAL; Future  3. Essential (primary) hypertension Controlled on current therapy - losartan-hydrochlorothiazide (HYZAAR) 100-25 MG tablet; Take 1 tablet by mouth daily.  Dispense: 90 tablet; Refill: 3 - amLODipine (NORVASC) 5 MG tablet; Take 1 tablet (5 mg total) by mouth daily.  Dispense: 90 tablet; Refill: 3 - CBC with Differential/Platelet - Comprehensive metabolic panel - TSH  4. Hyperlipidemia, mixed Check labs - Lipid panel  5. Need for influenza vaccination - Flu Vaccine QUAD 36+ mos IM  6. BMI 38.0-38.9,adult Pt will begin intermittent  fasting; diet principles and general guidelines explained  Partially dictated using Animal nutritionist. Any errors are unintentional.  Cheryl Edward, MD [facility]  07/25/2018

## 2018-07-26 LAB — CBC WITH DIFFERENTIAL/PLATELET
BASOS ABS: 0 10*3/uL (ref 0.0–0.2)
Basos: 0 %
EOS (ABSOLUTE): 0.1 10*3/uL (ref 0.0–0.4)
Eos: 2 %
Hematocrit: 39.3 % (ref 34.0–46.6)
Hemoglobin: 13.5 g/dL (ref 11.1–15.9)
IMMATURE GRANS (ABS): 0 10*3/uL (ref 0.0–0.1)
Immature Granulocytes: 0 %
LYMPHS: 41 %
Lymphocytes Absolute: 1.7 10*3/uL (ref 0.7–3.1)
MCH: 28.5 pg (ref 26.6–33.0)
MCHC: 34.4 g/dL (ref 31.5–35.7)
MCV: 83 fL (ref 79–97)
Monocytes Absolute: 0.3 10*3/uL (ref 0.1–0.9)
Monocytes: 8 %
NEUTROS ABS: 2.1 10*3/uL (ref 1.4–7.0)
Neutrophils: 49 %
PLATELETS: 313 10*3/uL (ref 150–450)
RBC: 4.73 x10E6/uL (ref 3.77–5.28)
RDW: 15.1 % (ref 12.3–15.4)
WBC: 4.2 10*3/uL (ref 3.4–10.8)

## 2018-07-26 LAB — LIPID PANEL
Chol/HDL Ratio: 3.6 ratio (ref 0.0–4.4)
Cholesterol, Total: 185 mg/dL (ref 100–199)
HDL: 52 mg/dL (ref >39)
LDL Calculated: 115 mg/dL — ABNORMAL HIGH (ref 0–99)
Triglycerides: 88 mg/dL (ref 0–149)
VLDL CHOLESTEROL CAL: 18 mg/dL (ref 5–40)

## 2018-07-26 LAB — COMPREHENSIVE METABOLIC PANEL
ALK PHOS: 93 IU/L (ref 39–117)
ALT: 15 IU/L (ref 0–32)
AST: 15 IU/L (ref 0–40)
Albumin/Globulin Ratio: 1.2 (ref 1.2–2.2)
Albumin: 4.1 g/dL (ref 3.5–5.5)
BILIRUBIN TOTAL: 1.1 mg/dL (ref 0.0–1.2)
BUN/Creatinine Ratio: 14 (ref 9–23)
BUN: 13 mg/dL (ref 6–24)
CHLORIDE: 98 mmol/L (ref 96–106)
CO2: 26 mmol/L (ref 20–29)
Calcium: 9.6 mg/dL (ref 8.7–10.2)
Creatinine, Ser: 0.9 mg/dL (ref 0.57–1.00)
GFR calc Af Amer: 83 mL/min/{1.73_m2} (ref >59)
GFR calc non Af Amer: 72 mL/min/{1.73_m2} (ref >59)
GLUCOSE: 82 mg/dL (ref 65–99)
Globulin, Total: 3.4 g/dL (ref 1.5–4.5)
Potassium: 4.1 mmol/L (ref 3.5–5.2)
Sodium: 138 mmol/L (ref 134–144)
TOTAL PROTEIN: 7.5 g/dL (ref 6.0–8.5)

## 2018-07-26 LAB — TSH: TSH: 3.06 u[IU]/mL (ref 0.450–4.500)

## 2018-08-22 ENCOUNTER — Other Ambulatory Visit: Payer: Self-pay

## 2018-08-22 MED ORDER — LOSARTAN POTASSIUM 100 MG PO TABS: 100.0000 mg | ORAL_TABLET | Freq: Every day | ORAL | 3 refills | Status: DC

## 2018-08-22 MED ORDER — HYDROCHLOROTHIAZIDE 25 MG PO TABS: 25.0000 mg | ORAL_TABLET | Freq: Every day | ORAL | 3 refills | Status: DC

## 2018-08-22 NOTE — Progress Notes (Signed)
Patient called stating she did not get her losartan-hctz medication. Called her pharmacy and found it was on backorder for the combined pill so sent in separate for losartan and hctz.  Called patient and left VM informing his information. Told her to call me back if she has any questions.

## 2018-09-12 ENCOUNTER — Ambulatory Visit
Admission: RE | Admit: 2018-09-12 | Discharge: 2018-09-12 | Disposition: A | Discharge status: Home / Self Care | Payer: 59 | Source: Ambulatory Visit | Attending: Internal Medicine | Admitting: Internal Medicine

## 2018-09-12 DIAGNOSIS — Z1239 Encounter for other screening for malignant neoplasm of breast: Secondary | ICD-10-CM | POA: Diagnosis not present

## 2019-01-23 ENCOUNTER — Ambulatory Visit: Payer: 59 | Admitting: Internal Medicine

## 2019-04-05 ENCOUNTER — Other Ambulatory Visit: Payer: Self-pay | Admitting: Internal Medicine

## 2019-04-07 ENCOUNTER — Other Ambulatory Visit: Payer: Self-pay | Admitting: Internal Medicine

## 2019-07-31 ENCOUNTER — Other Ambulatory Visit: Payer: Self-pay

## 2019-07-31 ENCOUNTER — Ambulatory Visit (INDEPENDENT_AMBULATORY_CARE_PROVIDER_SITE_OTHER): Payer: 59 | Admitting: Internal Medicine

## 2019-07-31 ENCOUNTER — Encounter: Payer: Self-pay | Admitting: Internal Medicine

## 2019-07-31 VITALS — BP 128/84 | HR 79 | Ht 68.0 in | Wt 242.0 lb

## 2019-07-31 DIAGNOSIS — Z23 Encounter for immunization: Secondary | ICD-10-CM | POA: Diagnosis not present

## 2019-07-31 DIAGNOSIS — Z0000 Encounter for general adult medical examination without abnormal findings: Secondary | ICD-10-CM

## 2019-07-31 DIAGNOSIS — E782 Mixed hyperlipidemia: Secondary | ICD-10-CM | POA: Diagnosis not present

## 2019-07-31 DIAGNOSIS — I10 Essential (primary) hypertension: Secondary | ICD-10-CM | POA: Diagnosis not present

## 2019-07-31 DIAGNOSIS — Z1231 Encounter for screening mammogram for malignant neoplasm of breast: Secondary | ICD-10-CM

## 2019-07-31 LAB — POCT URINALYSIS DIPSTICK
Bilirubin, UA: NEGATIVE
Blood, UA: NEGATIVE
Glucose, UA: NEGATIVE
Ketones, UA: NEGATIVE
Leukocytes, UA: NEGATIVE
Nitrite, UA: NEGATIVE
Protein, UA: NEGATIVE
Spec Grav, UA: 1.015 (ref 1.010–1.025)
Urobilinogen, UA: 0.2 E.U./dL
pH, UA: 7 (ref 5.0–8.0)

## 2019-07-31 MED ORDER — AMLODIPINE BESYLATE 5 MG PO TABS: 5.0000 mg | ORAL_TABLET | Freq: Every day | ORAL | 3 refills | Status: DC

## 2019-07-31 NOTE — Progress Notes (Signed)
Date:  07/31/2019   Name:  Cheryl Mosley   DOB:  03-Aug-1961   MRN:  [id number]   Chief Complaint: Annual Exam (Breast Exam. Flu shot.) Cheryl Mosley is a 58 y.o. female who presents today for her Complete Annual Exam. She feels well. She reports walking regularly. She reports she is sleeping fairly well. She denies breast issues. She has had a rough year - her husband passed away from cancer.  She is doing well with the support of her three sisters.  Mammogram  08/2018 Colonoscopy  2014 Pap no longer needed Tdap 2015  Hypertension This is a chronic problem. The problem is unchanged. The problem is controlled. Pertinent negatives include no chest pain, headaches, palpitations or shortness of breath. Past treatments include diuretics, calcium channel blockers and ACE inhibitors. The current treatment provides significant improvement. There are no compliance problems.   Hyperlipidemia This is a chronic problem. Condition status: very mild increase but with high HDL - 10 yr risk is low. Pertinent negatives include no chest pain or shortness of breath. She is currently on no antihyperlipidemic treatment.    Review of Systems  Constitutional: Negative for chills, fatigue and fever.  HENT: Negative for congestion, hearing loss, tinnitus, trouble swallowing and voice change.   Eyes: Negative for visual disturbance.  Respiratory: Negative for cough, chest tightness, shortness of breath and wheezing.   Cardiovascular: Negative for chest pain, palpitations and leg swelling.  Gastrointestinal: Negative for abdominal pain, constipation, diarrhea and vomiting.  Endocrine: Negative for polydipsia and polyuria.  Genitourinary: Negative for dysuria, frequency, genital sores, vaginal bleeding and vaginal discharge.  Musculoskeletal: Negative for arthralgias, gait problem and joint swelling.  Skin: Negative for color change and rash.  Neurological: Negative for dizziness, tremors,  light-headedness and headaches.  Hematological: Negative for adenopathy. Does not bruise/bleed easily.  Psychiatric/Behavioral: Negative for dysphoric mood and sleep disturbance. The patient is not nervous/anxious.     Patient Active Problem List   Diagnosis Date Noted  . BMI 36.0-36.9,adult 07/25/2018  . Cyst of right breast 02/06/2018  . Essential (primary) hypertension 06/16/2015  . Hyperlipidemia, mixed 06/16/2015  . 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 Hx of gastritis 06/14/2015    No Known Allergies  Past Surgical History:  Procedure Laterality Date  . CHOLECYSTECTOMY N/A 10/27/2015   Procedure: LAPAROSCOPIC CHOLECYSTECTOMY WITH INTRAOPERATIVE CHOLANGIOGRAM;  Surgeon: Ricarda Frame, MD;  Location: [facility];  Service: General;  Laterality: N/A;  . COLONOSCOPY  2015   normal- cleared for 10 yrs- Dr Servando Snare  . ESOPHAGOGASTRODUODENOSCOPY (EGD) WITH PROPOFOL N/A 09/19/2015   Procedure: ESOPHAGOGASTRODUODENOSCOPY (EGD) WITH PROPOFOL;  Surgeon: Midge Minium, MD;  Location: [facility];  Service: Endoscopy;  Laterality: N/A;  . VAGINAL HYSTERECTOMY  2005   partial    Social History   Tobacco Use  . Smoking status: Never Smoker  . Smokeless tobacco: Never Used  Substance Use Topics  . Alcohol use: No    Alcohol/week: 0.0 standard drinks  . Drug use: No     Medication list has been reviewed and updated.  Current Meds  Medication Sig  . amLODipine (NORVASC) 5 MG tablet Take 1 tablet (5 mg total) by mouth daily.  [name] losartan-hydrochlorothiazide (HYZAAR) 100-25 MG tablet TAKE 1 TABLET BY MOUTH EVERY DAY  . [DISCONTINUED] amLODipine (NORVASC) 5 MG tablet Take 1 tablet (5 mg total) by mouth daily.    PHQ 2/9 Scores 07/31/2019 07/25/2018 07/24/2017 05/11/2016  PHQ - 2 Score 0 0 0 0  BP Readings from Last 3 Encounters:  07/31/19 128/84  07/25/18 132/78  02/06/18 134/86    Physical Exam Vitals signs and nursing note reviewed.  Constitutional:       General: She is not in acute distress.    Appearance: She is well-developed.  HENT:     Head: Normocephalic and atraumatic.     Right Ear: Tympanic membrane and ear canal normal.     Left Ear: Tympanic membrane and ear canal normal.     Nose:     Right Sinus: No maxillary sinus tenderness.     Left Sinus: No maxillary sinus tenderness.  Eyes:     General: No scleral icterus.       Right eye: No discharge.        Left eye: No discharge.     Conjunctiva/sclera: Conjunctivae normal.  Neck:     Musculoskeletal: Normal range of motion. No erythema.     Thyroid: No thyromegaly.     Vascular: No carotid bruit.  Cardiovascular:     Rate and Rhythm: Normal rate and regular rhythm.     Pulses: Normal pulses.     Heart sounds: Normal heart sounds.  Pulmonary:     Effort: Pulmonary effort is normal. No respiratory distress.     Breath sounds: No wheezing.  Chest:     Breasts:        Right: No mass, nipple discharge, skin change or tenderness.        Left: No mass, nipple discharge, skin change or tenderness.  Abdominal:     General: Bowel sounds are normal.     Palpations: Abdomen is soft.     Tenderness: There is no abdominal tenderness.  Musculoskeletal: Normal range of motion.  Lymphadenopathy:     Cervical: No cervical adenopathy.  Skin:    General: Skin is warm and dry.     Capillary Refill: Capillary refill takes less than 2 seconds.     Findings: No rash.  Neurological:     General: No focal deficit present.     Mental Status: She is alert and oriented to person, place, and time.     Cranial Nerves: No cranial nerve deficit.     Sensory: No sensory deficit.     Deep Tendon Reflexes: Reflexes are normal and symmetric.  Psychiatric:        Attention and Perception: Attention normal.        Mood and Affect: Mood normal.        Speech: Speech normal.        Behavior: Behavior normal.        Thought Content: Thought content normal.        Cognition and Memory: Cognition  normal.     Wt Readings from Last 3 Encounters:  07/31/19 242 lb (109.8 kg)  07/25/18 252 lb (114.3 kg)  02/06/18 253 lb (114.8 kg)    BP 128/84   Pulse 79   Ht 5\' 8"  (1.727 m)   Wt 242 lb (109.8 kg)   SpO2 96%   BMI 36.80 kg/m   Assessment and Plan: 1. Annual physical exam Normal exam except for weight which is decreasing steadily Continue healthy diet and regular exercise - POCT urinalysis dipstick  2. Encounter for screening mammogram for malignant neoplasm of breast [facility] - MM 3D SCREEN BREAST BILATERAL; Future  3. Essential (primary) hypertension Clinically stable exam with well controlled BP.   Tolerating medications, losartan100/hctz 25 mg and amlodipine  5 mg, without side effects at this time. Pt to continue current regimen and low sodium diet; benefits of regular exercise as able discussed. - amLODipine (NORVASC) 5 MG tablet; Take 1 tablet (5 mg total) by mouth daily.  Dispense: 90 tablet; Refill: 3 - CBC with Differential/Platelet - Comprehensive metabolic panel - TSH  4. Hyperlipidemia, mixed Will check lipids and advise if medication is needed Last evaluation 10 yr risk was very low and expect is will improve with diet and weight loss - Lipid panel   Partially dictated using Dragon software. Any errors are unintentional.  Halina Maidens, MD [facility]  07/31/2019

## 2019-08-01 LAB — COMPREHENSIVE METABOLIC PANEL
ALT: 33 IU/L — ABNORMAL HIGH (ref 0–32)
AST: 32 IU/L (ref 0–40)
Albumin/Globulin Ratio: 1.3 (ref 1.2–2.2)
Albumin: 4.4 g/dL (ref 3.8–4.9)
Alkaline Phosphatase: 113 IU/L (ref 39–117)
BUN/Creatinine Ratio: 21 (ref 9–23)
BUN: 16 mg/dL (ref 6–24)
Bilirubin Total: 1 mg/dL (ref 0.0–1.2)
CO2: 27 mmol/L (ref 20–29)
Calcium: 10 mg/dL (ref 8.7–10.2)
Chloride: 101 mmol/L (ref 96–106)
Creatinine, Ser: 0.77 mg/dL (ref 0.57–1.00)
GFR calc Af Amer: 99 mL/min/{1.73_m2} (ref >59)
GFR calc non Af Amer: 86 mL/min/{1.73_m2} (ref >59)
Globulin, Total: 3.4 g/dL (ref 1.5–4.5)
Glucose: 94 mg/dL (ref 65–99)
Potassium: 3.9 mmol/L (ref 3.5–5.2)
Sodium: 140 mmol/L (ref 134–144)
Total Protein: 7.8 g/dL (ref 6.0–8.5)

## 2019-08-01 LAB — CBC WITH DIFFERENTIAL/PLATELET
Basophils Absolute: 0 10*3/uL (ref 0.0–0.2)
Basos: 0 %
EOS (ABSOLUTE): 0.1 10*3/uL (ref 0.0–0.4)
Eos: 2 %
Hematocrit: 40.7 % (ref 34.0–46.6)
Hemoglobin: 13.7 g/dL (ref 11.1–15.9)
Immature Grans (Abs): 0 10*3/uL (ref 0.0–0.1)
Immature Granulocytes: 0 %
Lymphocytes Absolute: 1.8 10*3/uL (ref 0.7–3.1)
Lymphs: 36 %
MCH: 28.1 pg (ref 26.6–33.0)
MCHC: 33.7 g/dL (ref 31.5–35.7)
MCV: 83 fL (ref 79–97)
Monocytes Absolute: 0.5 10*3/uL (ref 0.1–0.9)
Monocytes: 9 %
Neutrophils Absolute: 2.7 10*3/uL (ref 1.4–7.0)
Neutrophils: 53 %
Platelets: 274 10*3/uL (ref 150–450)
RBC: 4.88 x10E6/uL (ref 3.77–5.28)
RDW: 14.2 % (ref 11.7–15.4)
WBC: 5.1 10*3/uL (ref 3.4–10.8)

## 2019-08-01 LAB — LIPID PANEL
Chol/HDL Ratio: 3.2 ratio (ref 0.0–4.4)
Cholesterol, Total: 202 mg/dL — ABNORMAL HIGH (ref 100–199)
HDL: 63 mg/dL (ref >39)
LDL Chol Calc (NIH): 127 mg/dL — ABNORMAL HIGH (ref 0–99)
Triglycerides: 68 mg/dL (ref 0–149)
VLDL Cholesterol Cal: 12 mg/dL (ref 5–40)

## 2019-08-01 LAB — TSH: TSH: 2.15 u[IU]/mL (ref 0.450–4.500)

## 2019-11-02 ENCOUNTER — Ambulatory Visit
Admission: RE | Admit: 2019-11-02 | Discharge: 2019-11-02 | Disposition: A | Discharge status: Home / Self Care | Payer: 59 | Source: Ambulatory Visit | Attending: Internal Medicine | Admitting: Internal Medicine

## 2019-11-02 DIAGNOSIS — Z1231 Encounter for screening mammogram for malignant neoplasm of breast: Secondary | ICD-10-CM | POA: Insufficient documentation

## 2020-01-09 ENCOUNTER — Ambulatory Visit: Payer: 59 | Attending: Internal Medicine

## 2020-01-09 DIAGNOSIS — Z23 Encounter for immunization: Secondary | ICD-10-CM

## 2020-01-09 NOTE — Progress Notes (Signed)
   Covid-19 Vaccination Clinic  Name:  Cheryl Mosley    MRN: [id number] DOB: 02/08/1961  01/09/2020  [name] was observed post Covid-19 immunization for 15 minutes without incident. She was provided with Vaccine Information Sheet and instruction to access the V-Safe system.   [name] was instructed to call 911 with any severe reactions post vaccine: [name] Difficulty breathing  . Swelling of face and throat  . A fast heartbeat  . A bad rash all over body  . Dizziness and weakness   Immunizations Administered    Name Date Dose VIS Date Route   Pfizer COVID-19 Vaccine 01/09/2020  8:47 AM 0.3 mL 10/09/2019 Intramuscular   Manufacturer: [employer], [employer]   Lot: [id number]   NDC: [address]-0

## 2020-02-02 ENCOUNTER — Ambulatory Visit: Payer: 59 | Attending: Internal Medicine

## 2020-02-02 DIAGNOSIS — Z23 Encounter for immunization: Secondary | ICD-10-CM

## 2020-02-02 NOTE — Progress Notes (Signed)
   Covid-19 Vaccination Clinic  Name:  Cheryl Mosley    MRN: [id number] DOB: 08/20/1961  02/02/2020  [name] was observed post Covid-19 immunization for 15 minutes without incident. She was provided with Vaccine Information Sheet and instruction to access the V-Safe system.   [name] was instructed to call 911 with any severe reactions post vaccine: [name] Difficulty breathing  . Swelling of face and throat  . A fast heartbeat  . A bad rash all over body  . Dizziness and weakness   Immunizations Administered    Name Date Dose VIS Date Route   Pfizer COVID-19 Vaccine 02/02/2020  9:55 AM 0.3 mL 10/09/2019 Intramuscular   Manufacturer: [employer], [employer]   Lot: [id number]   NDC: [address]-6

## 2020-02-04 ENCOUNTER — Ambulatory Visit (INDEPENDENT_AMBULATORY_CARE_PROVIDER_SITE_OTHER): Payer: 59 | Admitting: Internal Medicine

## 2020-02-04 ENCOUNTER — Encounter: Payer: Self-pay | Admitting: Internal Medicine

## 2020-02-04 ENCOUNTER — Other Ambulatory Visit: Payer: Self-pay

## 2020-02-04 VITALS — BP 132/78 | HR 78 | Temp 97.3°F | Ht 68.0 in | Wt 251.0 lb

## 2020-02-04 DIAGNOSIS — I10 Essential (primary) hypertension: Secondary | ICD-10-CM

## 2020-02-04 NOTE — Progress Notes (Signed)
Date:  02/04/2020   Name:  Cheryl Mosley   DOB:  1961-05-06   MRN:  [id number]   Chief Complaint: Hypertension (Follow up- 6 months. )  Hypertension This is a chronic problem. The problem is controlled (at home readings are 130's/80). Pertinent negatives include no blurred vision, chest pain, headaches, palpitations, peripheral edema or shortness of breath. There are no associated agents to hypertension. Past treatments include angiotensin blockers, diuretics and calcium channel blockers. The current treatment provides significant improvement.    Lab Results  Component Value Date   CREATININE 0.77 07/31/2019   BUN 16 07/31/2019   NA 140 07/31/2019   K 3.9 07/31/2019   CL 101 07/31/2019   CO2 27 07/31/2019   Lab Results  Component Value Date   CHOL 202 (H) 07/31/2019   HDL 63 07/31/2019   LDLCALC 127 (H) 07/31/2019   TRIG 68 07/31/2019   CHOLHDL 3.2 07/31/2019   Lab Results  Component Value Date   TSH 2.150 07/31/2019   No results found for: HGBA1C Lab Results  Component Value Date   WBC 5.1 07/31/2019   HGB 13.7 07/31/2019   HCT 40.7 07/31/2019   MCV 83 07/31/2019   PLT 274 07/31/2019   Lab Results  Component Value Date   ALT 33 (H) 07/31/2019   AST 32 07/31/2019   ALKPHOS 113 07/31/2019   BILITOT 1.0 07/31/2019     Review of Systems  Constitutional: Negative for chills, fever and unexpected weight change.  Eyes: Negative for blurred vision.  Respiratory: Negative for chest tightness, shortness of breath and wheezing.   Cardiovascular: Negative for chest pain, palpitations and leg swelling.  Gastrointestinal: Positive for constipation (occasional).  Neurological: Negative for dizziness, light-headedness and headaches.    Patient Active Problem List   Diagnosis Date Noted  . BMI 36.0-36.9,adult 07/25/2018  . Cyst of right breast 02/06/2018  . Essential (primary) hypertension 06/16/2015  . Hyperlipidemia, mixed 06/16/2015  . Arthropathy of  temporomandibular joint 06/16/2015  . Hx of gastritis 06/14/2015    No Known Allergies  Past Surgical History:  Procedure Laterality Date  . CHOLECYSTECTOMY N/A 10/27/2015   Procedure: LAPAROSCOPIC CHOLECYSTECTOMY WITH INTRAOPERATIVE CHOLANGIOGRAM;  Surgeon: Ricarda Frame, MD;  Location: [facility] ORS;  Service: General;  Laterality: N/A;  . COLONOSCOPY  2015   normal- cleared for 10 yrs- Dr Servando Snare  . ESOPHAGOGASTRODUODENOSCOPY (EGD) WITH PROPOFOL N/A 09/19/2015   Procedure: ESOPHAGOGASTRODUODENOSCOPY (EGD) WITH PROPOFOL;  Surgeon: Midge Minium, MD;  Location: [facility];  Service: Endoscopy;  Laterality: N/A;  . VAGINAL HYSTERECTOMY  2005   partial    Social History   Tobacco Use  . Smoking status: Never Smoker  . Smokeless tobacco: Never Used  Substance Use Topics  . Alcohol use: No    Alcohol/week: 0.0 standard drinks  . Drug use: No     Medication list has been reviewed and updated.  Current Meds  Medication Sig  . amLODipine (NORVASC) 5 MG tablet Take 1 tablet (5 mg total) by mouth daily.  [name] losartan-hydrochlorothiazide (HYZAAR) 100-25 MG tablet TAKE 1 TABLET BY MOUTH EVERY DAY    PHQ 2/9 Scores 02/04/2020 07/31/2019 07/25/2018 07/24/2017  PHQ - 2 Score 0 0 0 0  PHQ- 9 Score 0 - - -    BP Readings from Last 3 Encounters:  02/04/20 132/78  07/31/19 128/84  07/25/18 132/78    Physical Exam Vitals and nursing note reviewed.  Constitutional:      General: She is not  in acute distress.    Appearance: She is well-developed.  HENT:     Head: Normocephalic and atraumatic.  Cardiovascular:     Rate and Rhythm: Normal rate and regular rhythm.     Pulses: Normal pulses.     Heart sounds: Normal heart sounds. No murmur.  Pulmonary:     Effort: Pulmonary effort is normal. No respiratory distress.     Breath sounds: Normal breath sounds.  Musculoskeletal:     Cervical back: Normal range of motion.     Right lower leg: No edema.     Left lower leg: No edema.    Skin:    General: Skin is warm and dry.     Capillary Refill: Capillary refill takes less than 2 seconds.     Findings: No rash.  Neurological:     Mental Status: She is alert and oriented to person, place, and time.  Psychiatric:        Attention and Perception: Attention normal.        Mood and Affect: Mood normal.     Wt Readings from Last 3 Encounters:  02/04/20 251 lb (113.9 kg)  07/31/19 242 lb (109.8 kg)  07/25/18 252 lb (114.3 kg)    BP 132/78   Pulse 78   Temp (!) 97.3 F (36.3 C) (Temporal)   Ht 5\' 8"  (1.727 m)   Wt 251 lb (113.9 kg)   SpO2 100%   BMI 38.16 kg/m   Assessment and Plan: 1. Essential (primary) hypertension Clinically stable exam with well controlled BP on current therapy. Tolerating medications without side effects at this time. Pt to continue current regimen and low sodium diet; benefits of regular exercise as able discussed.  She has a treadmill at home and access to a stationary bike at work.   Partially dictated using Editor, commissioning. Any errors are unintentional.  Halina Maidens, MD [facility]  02/04/2020

## 2020-04-10 ENCOUNTER — Other Ambulatory Visit: Payer: Self-pay | Admitting: Internal Medicine

## 2020-08-03 ENCOUNTER — Ambulatory Visit (INDEPENDENT_AMBULATORY_CARE_PROVIDER_SITE_OTHER): Payer: 59 | Admitting: Internal Medicine

## 2020-08-03 ENCOUNTER — Other Ambulatory Visit: Payer: Self-pay

## 2020-08-03 ENCOUNTER — Encounter: Payer: Self-pay | Admitting: Internal Medicine

## 2020-08-03 VITALS — BP 128/78 | HR 69 | Ht 68.0 in | Wt 254.0 lb

## 2020-08-03 DIAGNOSIS — Z0000 Encounter for general adult medical examination without abnormal findings: Secondary | ICD-10-CM

## 2020-08-03 DIAGNOSIS — Z1231 Encounter for screening mammogram for malignant neoplasm of breast: Secondary | ICD-10-CM

## 2020-08-03 DIAGNOSIS — I10 Essential (primary) hypertension: Secondary | ICD-10-CM | POA: Diagnosis not present

## 2020-08-03 DIAGNOSIS — E782 Mixed hyperlipidemia: Secondary | ICD-10-CM

## 2020-08-03 LAB — POCT URINALYSIS DIPSTICK
Bilirubin, UA: NEGATIVE
Glucose, UA: NEGATIVE
Ketones, UA: NEGATIVE
Nitrite, UA: NEGATIVE
Protein, UA: NEGATIVE
Spec Grav, UA: 1.015 (ref 1.010–1.025)
Urobilinogen, UA: 0.2 E.U./dL
pH, UA: 5 (ref 5.0–8.0)

## 2020-08-03 NOTE — Progress Notes (Signed)
Date:  08/03/2020   Name:  Cheryl Mosley   DOB:  [birth year]   MRN:  [id number]   Chief Complaint: Annual Exam (Breast Exam. No pap- discontinued. )  Cheryl Mosley is a 59 y.o. female who presents today for her Complete Annual Exam. She feels well. She reports exercising - trying to exercise weekly. She reports she is sleeping well. Breast complaints - none.  Mammogram: 10/2019 Pap smear: discontinued Colonoscopy: 09/2013 repeat 2024  Immunization History  Administered Date(s) Administered  . Influenza,inj,Quad PF,6+ Mos 08/29/2015, 08/06/2016, 07/24/2017, 07/25/2018, 07/31/2019  . PFIZER SARS-COV-2 Vaccination 01/09/2020, 02/02/2020  . Tdap 06/28/2014    Hypertension This is a chronic problem. The problem is controlled. Pertinent negatives include no chest pain, headaches, palpitations or shortness of breath. Past treatments include angiotensin blockers, diuretics and calcium channel blockers. The current treatment provides significant improvement. There are no compliance problems.     Lab Results  Component Value Date   CREATININE 0.77 07/31/2019   BUN 16 07/31/2019   NA 140 07/31/2019   K 3.9 07/31/2019   CL 101 07/31/2019   CO2 27 07/31/2019   Lab Results  Component Value Date   CHOL 202 (H) 07/31/2019   HDL 63 07/31/2019   LDLCALC 127 (H) 07/31/2019   TRIG 68 07/31/2019   CHOLHDL 3.2 07/31/2019   Lab Results  Component Value Date   TSH 2.150 07/31/2019   No results found for: HGBA1C Lab Results  Component Value Date   WBC 5.1 07/31/2019   HGB 13.7 07/31/2019   HCT 40.7 07/31/2019   MCV 83 07/31/2019   PLT 274 07/31/2019   Lab Results  Component Value Date   ALT 33 (H) 07/31/2019   AST 32 07/31/2019   ALKPHOS 113 07/31/2019   BILITOT 1.0 07/31/2019     Review of Systems  Constitutional: Negative for chills, fatigue, fever and unexpected weight change (working on weight loss).  HENT: Negative for congestion, hearing loss, tinnitus, trouble  swallowing and voice change.   Eyes: Negative for visual disturbance.  Respiratory: Negative for cough, chest tightness, shortness of breath and wheezing.   Cardiovascular: Negative for chest pain, palpitations and leg swelling.  Gastrointestinal: Negative for abdominal pain, constipation, diarrhea and vomiting.  Endocrine: Negative for polydipsia and polyuria.  Genitourinary: Negative for dysuria, frequency, genital sores, vaginal bleeding and vaginal discharge.  Musculoskeletal: Negative for arthralgias, gait problem and joint swelling.  Skin: Negative for color change and rash.  Neurological: Negative for dizziness, tremors, light-headedness and headaches.  Hematological: Negative for adenopathy. Does not bruise/bleed easily.  Psychiatric/Behavioral: Negative for dysphoric mood and sleep disturbance. The patient is not nervous/anxious.     Patient Active Problem List   Diagnosis Date Noted  . BMI 36.0-36.9,adult 07/25/2018  . Cyst of right breast 02/06/2018  . Essential (primary) hypertension 06/16/2015  . Hyperlipidemia, mixed 06/16/2015  . 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 Hx of gastritis 06/14/2015    No Known Allergies  Past Surgical History:  Procedure Laterality Date  . CHOLECYSTECTOMY N/A 10/27/2015   Procedure: LAPAROSCOPIC CHOLECYSTECTOMY WITH INTRAOPERATIVE CHOLANGIOGRAM;  Surgeon: Ricarda Frame, MD;  Location: [facility];  Service: General;  Laterality: N/A;  . COLONOSCOPY  2015   normal- cleared for 10 yrs- Dr Servando Snare  . ESOPHAGOGASTRODUODENOSCOPY (EGD) WITH PROPOFOL N/A 09/19/2015   Procedure: ESOPHAGOGASTRODUODENOSCOPY (EGD) WITH PROPOFOL;  Surgeon: Midge Minium, MD;  Location: [facility];  Service: Endoscopy;  Laterality: N/A;  . VAGINAL HYSTERECTOMY  2005  partial    Social History   Tobacco Use  . Smoking status: Never Smoker  . Smokeless tobacco: Never Used  Vaping Use  . Vaping Use: Never used  Substance Use Topics  .  Alcohol use: No    Alcohol/week: 0.0 standard drinks  . Drug use: No     Medication list has been reviewed and updated.  Current Meds  Medication Sig  . amLODipine (NORVASC) 5 MG tablet Take 1 tablet (5 mg total) by mouth daily.  [name] losartan-hydrochlorothiazide (HYZAAR) 100-25 MG tablet TAKE 1 TABLET BY MOUTH EVERY DAY    PHQ 2/9 Scores 08/03/2020 02/04/2020 07/31/2019 07/25/2018  PHQ - 2 Score 0 0 0 0  PHQ- 9 Score 0 0 - -    GAD 7 : Generalized Anxiety Score 08/03/2020 02/04/2020  Nervous, Anxious, on Edge 0 0  Control/stop worrying 0 0  Worry too much - different things 0 0  Trouble relaxing 0 0  Restless 0 0  Easily annoyed or irritable 0 0  Afraid - awful might happen 0 0  Total GAD 7 Score 0 0  Anxiety Difficulty Not difficult at all Not difficult at all    BP Readings from Last 3 Encounters:  08/03/20 128/78  02/04/20 132/78  07/31/19 128/84    Physical Exam Vitals and nursing note reviewed.  Constitutional:      General: She is not in acute distress.    Appearance: She is well-developed.  HENT:     Head: Normocephalic and atraumatic.     Right Ear: Tympanic membrane and ear canal normal.     Left Ear: Tympanic membrane and ear canal normal.     Nose:     Right Sinus: No maxillary sinus tenderness.     Left Sinus: No maxillary sinus tenderness.  Eyes:     General: No scleral icterus.       Right eye: No discharge.        Left eye: No discharge.     Conjunctiva/sclera: Conjunctivae normal.  Neck:     Thyroid: No thyromegaly.     Vascular: No carotid bruit.  Cardiovascular:     Rate and Rhythm: Normal rate and regular rhythm.     Pulses: Normal pulses.     Heart sounds: Normal heart sounds.  Pulmonary:     Effort: Pulmonary effort is normal. No respiratory distress.     Breath sounds: No wheezing.  Chest:     Breasts:        Right: No mass, nipple discharge, skin change or tenderness.        Left: No mass, nipple discharge, skin change or tenderness.    Abdominal:     General: Bowel sounds are normal.     Palpations: Abdomen is soft.     Tenderness: There is no abdominal tenderness.  Musculoskeletal:     Cervical back: Normal range of motion. No erythema.     Right lower leg: No edema.     Left lower leg: No edema.  Lymphadenopathy:     Cervical: No cervical adenopathy.  Skin:    General: Skin is warm and dry.     Findings: No rash.  Neurological:     Mental Status: She is alert and oriented to person, place, and time.     Cranial Nerves: No cranial nerve deficit.     Sensory: No sensory deficit.     Deep Tendon Reflexes: Reflexes are normal and symmetric.  Psychiatric:  Attention and Perception: Attention normal.        Mood and Affect: Mood normal.     Wt Readings from Last 3 Encounters:  08/03/20 254 lb (115.2 kg)  02/04/20 251 lb (113.9 kg)  07/31/19 242 lb (109.8 kg)    BP 128/78   Pulse 69   Ht 5\' 8"  (1.727 m)   Wt 254 lb (115.2 kg)   SpO2 98%   BMI 38.62 kg/m   Assessment and Plan: 1. Annual physical exam Exam is normal except for weight. Encourage regular exercise and appropriate dietary changes. More regular exercise is needed. Also discussed options such as intermittent fasting, not eating within 4 hours of bedtime, etc. - POCT urinalysis dipstick - contaminated  2. Essential (primary) hypertension Clinically stable exam with well controlled BP. Tolerating medications without side effects at this time. Pt to continue current regimen and low sodium diet; benefits of regular exercise as able discussed. - CBC with Differential/Platelet - Comprehensive metabolic panel - TSH  3. Hyperlipidemia, mixed Continue healthy diet - Lipid panel  4. Encounter for screening mammogram for breast cancer Due in January - MM 3D SCREEN BREAST BILATERAL; Future   Partially dictated using February. Any errors are unintentional.  Animal nutritionist, MD [facility]  08/03/2020

## 2020-08-04 LAB — CBC WITH DIFFERENTIAL/PLATELET
Basophils Absolute: 0 10*3/uL (ref 0.0–0.2)
Basos: 1 %
EOS (ABSOLUTE): 0.2 10*3/uL (ref 0.0–0.4)
Eos: 4 %
Hematocrit: 42.1 % (ref 34.0–46.6)
Hemoglobin: 13.4 g/dL (ref 11.1–15.9)
Immature Grans (Abs): 0 10*3/uL (ref 0.0–0.1)
Immature Granulocytes: 0 %
Lymphocytes Absolute: 1.5 10*3/uL (ref 0.7–3.1)
Lymphs: 38 %
MCH: 27.2 pg (ref 26.6–33.0)
MCHC: 31.8 g/dL (ref 31.5–35.7)
MCV: 85 fL (ref 79–97)
Monocytes Absolute: 0.5 10*3/uL (ref 0.1–0.9)
Monocytes: 13 %
Neutrophils Absolute: 1.8 10*3/uL (ref 1.4–7.0)
Neutrophils: 44 %
Platelets: 243 10*3/uL (ref 150–450)
RBC: 4.93 x10E6/uL (ref 3.77–5.28)
RDW: 14 % (ref 11.7–15.4)
WBC: 4.1 10*3/uL (ref 3.4–10.8)

## 2020-08-04 LAB — COMPREHENSIVE METABOLIC PANEL
ALT: 18 IU/L (ref 0–32)
AST: 16 IU/L (ref 0–40)
Albumin/Globulin Ratio: 1.2 (ref 1.2–2.2)
Albumin: 4.2 g/dL (ref 3.8–4.9)
Alkaline Phosphatase: 101 IU/L (ref 44–121)
BUN/Creatinine Ratio: 12 (ref 9–23)
BUN: 11 mg/dL (ref 6–24)
Bilirubin Total: 0.8 mg/dL (ref 0.0–1.2)
CO2: 28 mmol/L (ref 20–29)
Calcium: 9.5 mg/dL (ref 8.7–10.2)
Chloride: 98 mmol/L (ref 96–106)
Creatinine, Ser: 0.92 mg/dL (ref 0.57–1.00)
GFR calc Af Amer: 79 mL/min/{1.73_m2} (ref >59)
GFR calc non Af Amer: 69 mL/min/{1.73_m2} (ref >59)
Globulin, Total: 3.5 g/dL (ref 1.5–4.5)
Glucose: 92 mg/dL (ref 65–99)
Potassium: 4 mmol/L (ref 3.5–5.2)
Sodium: 140 mmol/L (ref 134–144)
Total Protein: 7.7 g/dL (ref 6.0–8.5)

## 2020-08-04 LAB — LIPID PANEL
Chol/HDL Ratio: 3.5 ratio (ref 0.0–4.4)
Cholesterol, Total: 204 mg/dL — ABNORMAL HIGH (ref 100–199)
HDL: 58 mg/dL (ref >39)
LDL Chol Calc (NIH): 131 mg/dL — ABNORMAL HIGH (ref 0–99)
Triglycerides: 83 mg/dL (ref 0–149)
VLDL Cholesterol Cal: 15 mg/dL (ref 5–40)

## 2020-08-04 LAB — TSH: TSH: 2.58 u[IU]/mL (ref 0.450–4.500)

## 2020-08-26 ENCOUNTER — Other Ambulatory Visit: Payer: Self-pay | Admitting: Internal Medicine

## 2020-08-26 DIAGNOSIS — I10 Essential (primary) hypertension: Secondary | ICD-10-CM

## 2020-08-26 NOTE — Telephone Encounter (Signed)
Requested medication (s) are due for refill today: Yes  Requested medication (s) are on the active medication list: Yes  Last refill:  07/31/19  Future visit scheduled: Yes  Notes to clinic:  Prescription has expired.    Requested Prescriptions  Pending Prescriptions Disp Refills   amLODipine (NORVASC) 5 MG tablet [Pharmacy Med Name: AMLODIPINE BESYLATE 5 MG TAB] 30 tablet 11    Sig: TAKE 1 TABLET BY MOUTH EVERY DAY      Cardiovascular:  Calcium Channel Blockers Passed - 08/26/2020  2:35 AM      Passed - Last BP in normal range    BP Readings from Last 1 Encounters:  08/03/20 128/78          Passed - Valid encounter within last 6 months    Recent Outpatient Visits           3 weeks ago Annual physical exam   [facility] Reubin Milan, MD   6 months ago Essential (primary) hypertension   [facility] Reubin Milan, MD   1 year ago Annual physical exam   [facility] Reubin Milan, MD   2 years ago Annual physical exam   [facility] Reubin Milan, MD   2 years ago Essential (primary) hypertension   [facility] Reubin Milan, MD       Future Appointments             In 5 months Judithann Graves Nyoka Cowden, MD [facility]   In 11 months Judithann Graves Nyoka Cowden, MD [facility], [facility]

## 2020-11-16 ENCOUNTER — Telehealth: Payer: Self-pay

## 2020-11-16 ENCOUNTER — Other Ambulatory Visit: Payer: Self-pay

## 2020-11-16 ENCOUNTER — Telehealth: Payer: Self-pay | Admitting: Internal Medicine

## 2020-11-16 DIAGNOSIS — I10 Essential (primary) hypertension: Secondary | ICD-10-CM

## 2020-11-16 MED ORDER — LOSARTAN POTASSIUM 100 MG PO TABS: 100.0000 mg | ORAL_TABLET | Freq: Every day | ORAL | 0 refills | Status: DC

## 2020-11-16 MED ORDER — HYDROCHLOROTHIAZIDE 25 MG PO TABS: 25.0000 mg | ORAL_TABLET | Freq: Every day | ORAL | 0 refills | Status: DC

## 2020-11-16 NOTE — Telephone Encounter (Signed)
Called pt told her that we could send in the medications separately that she would be taking 2 pills instead of 1. Pt said she tried that way before and does not like having to take 2 pills. Pt said she will call the pharmacy today to see if they will have it ready for her and she will give [place] a call if she needs for the meds to be sent in separately.  KP

## 2020-11-16 NOTE — Telephone Encounter (Signed)
Pt call stated that her pharmacy would not be able to refill her losartan-HCTZ. Pt said pharmacy told her there is a shortage on medication. Pt tried to call another pharmacy and there was a shortage there as well. Will send in

## 2020-11-16 NOTE — Telephone Encounter (Signed)
[facility] losartan-hydrochlorothiazide (HYZAAR) 100-25 MG tablet in today.   [facility]. She wants to know what should she do if [facility] cannot [facility] her [facility]?  Is there a back up plan for this medication?

## 2020-11-16 NOTE — Telephone Encounter (Signed)
Will send in both medications separately. [facility] [place]   KP

## 2020-12-05 ENCOUNTER — Ambulatory Visit
Admission: RE | Admit: 2020-12-05 | Discharge: 2020-12-05 | Disposition: A | Discharge status: Home / Self Care | Payer: 59 | Source: Ambulatory Visit | Attending: Internal Medicine | Admitting: Internal Medicine

## 2020-12-05 ENCOUNTER — Other Ambulatory Visit: Payer: Self-pay

## 2020-12-05 DIAGNOSIS — Z1231 Encounter for screening mammogram for malignant neoplasm of breast: Secondary | ICD-10-CM | POA: Insufficient documentation

## 2020-12-11 ENCOUNTER — Other Ambulatory Visit: Payer: Self-pay | Admitting: Internal Medicine

## 2020-12-11 DIAGNOSIS — I10 Essential (primary) hypertension: Secondary | ICD-10-CM

## 2021-02-01 ENCOUNTER — Encounter: Payer: Self-pay | Admitting: Internal Medicine

## 2021-02-01 ENCOUNTER — Other Ambulatory Visit: Payer: Self-pay

## 2021-02-01 ENCOUNTER — Ambulatory Visit (INDEPENDENT_AMBULATORY_CARE_PROVIDER_SITE_OTHER): Payer: 59 | Admitting: Internal Medicine

## 2021-02-01 VITALS — BP 132/86 | HR 78 | Temp 98.0°F | Ht 68.0 in | Wt 254.0 lb

## 2021-02-01 DIAGNOSIS — I10 Essential (primary) hypertension: Secondary | ICD-10-CM | POA: Diagnosis not present

## 2021-02-01 NOTE — Progress Notes (Signed)
Date:  02/01/2021   Name:  Cheryl Mosley   DOB:  Sep 20, 1961   MRN:  [id number]   Chief Complaint: Hypertension  Hypertension This is a chronic problem. The problem is controlled. Pertinent negatives include no chest pain, headaches, palpitations or shortness of breath. Past treatments include angiotensin blockers, calcium channel blockers and diuretics. The current treatment provides significant improvement. There are no compliance problems.  There is no history of kidney disease.    Lab Results  Component Value Date   CREATININE 0.92 08/03/2020   BUN 11 08/03/2020   NA 140 08/03/2020   K 4.0 08/03/2020   CL 98 08/03/2020   CO2 28 08/03/2020   Lab Results  Component Value Date   CHOL 204 (H) 08/03/2020   HDL 58 08/03/2020   LDLCALC 131 (H) 08/03/2020   TRIG 83 08/03/2020   CHOLHDL 3.5 08/03/2020   Lab Results  Component Value Date   TSH 2.580 08/03/2020   No results found for: HGBA1C Lab Results  Component Value Date   WBC 4.1 08/03/2020   HGB 13.4 08/03/2020   HCT 42.1 08/03/2020   MCV 85 08/03/2020   PLT 243 08/03/2020   Lab Results  Component Value Date   ALT 18 08/03/2020   AST 16 08/03/2020   ALKPHOS 101 08/03/2020   BILITOT 0.8 08/03/2020     Review of Systems  Constitutional: Negative for fatigue and unexpected weight change.  HENT: Negative for nosebleeds.   Eyes: Negative for visual disturbance.  Respiratory: Negative for cough, chest tightness, shortness of breath and wheezing.   Cardiovascular: Negative for chest pain, palpitations and leg swelling.  Gastrointestinal: Negative for abdominal pain, constipation and diarrhea.  Neurological: Negative for dizziness, weakness, light-headedness and headaches.  Psychiatric/Behavioral: Positive for sleep disturbance.    Patient Active Problem List   Diagnosis Date Noted  . BMI 36.0-36.9,adult 07/25/2018  . Cyst of right breast 02/06/2018  . Essential (primary) hypertension 06/16/2015  .  Hyperlipidemia, mixed 06/16/2015  . 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 Hx of gastritis 06/14/2015    No Known Allergies  Past Surgical History:  Procedure Laterality Date  . CHOLECYSTECTOMY N/A 10/27/2015   Procedure: LAPAROSCOPIC CHOLECYSTECTOMY WITH INTRAOPERATIVE CHOLANGIOGRAM;  Surgeon: Ricarda Frame, MD;  Location: [facility];  Service: General;  Laterality: N/A;  . COLONOSCOPY  2015   normal- cleared for 10 yrs- Dr Servando Snare  . ESOPHAGOGASTRODUODENOSCOPY (EGD) WITH PROPOFOL N/A 09/19/2015   Procedure: ESOPHAGOGASTRODUODENOSCOPY (EGD) WITH PROPOFOL;  Surgeon: Midge Minium, MD;  Location: [facility];  Service: Endoscopy;  Laterality: N/A;  . VAGINAL HYSTERECTOMY  2005   partial    Social History   Tobacco Use  . Smoking status: Never Smoker  . Smokeless tobacco: Never Used  Vaping Use  . Vaping Use: Never used  Substance Use Topics  . Alcohol use: No    Alcohol/week: 0.0 standard drinks  . Drug use: No     Medication list has been reviewed and updated.  Current Meds  Medication Sig  . amLODipine (NORVASC) 5 MG tablet TAKE 1 TABLET BY MOUTH EVERY DAY  . hydrochlorothiazide (HYDRODIURIL) 25 MG tablet TAKE 1 TABLET (25 MG TOTAL) BY MOUTH DAILY.  [name] losartan (COZAAR) 100 MG tablet TAKE 1 TABLET BY MOUTH EVERY DAY    PHQ 2/9 Scores 02/01/2021 08/03/2020 02/04/2020 07/31/2019  PHQ - 2 Score 0 0 0 0  PHQ- 9 Score 0 0 0 -    GAD 7 : Generalized Anxiety Score  02/01/2021 08/03/2020 02/04/2020  Nervous, Anxious, on Edge 0 0 0  Control/stop worrying 0 0 0  Worry too much - different things 0 0 0  Trouble relaxing 0 0 0  Restless 0 0 0  Easily annoyed or irritable 0 0 0  Afraid - awful might happen 0 0 0  Total GAD 7 Score 0 0 0  Anxiety Difficulty - Not difficult at all Not difficult at all    BP Readings from Last 3 Encounters:  02/01/21 132/86  08/03/20 128/78  02/04/20 132/78    Physical Exam Vitals and nursing note reviewed.  Constitutional:       General: She is not in acute distress.    Appearance: She is well-developed.  HENT:     Head: Normocephalic and atraumatic.  Cardiovascular:     Rate and Rhythm: Normal rate and regular rhythm.     Pulses: Normal pulses.     Heart sounds: No murmur heard.   Pulmonary:     Effort: Pulmonary effort is normal. No respiratory distress.     Breath sounds: No wheezing or rhonchi.  Musculoskeletal:     Cervical back: Normal range of motion.     Right lower leg: No edema.     Left lower leg: No edema.  Skin:    General: Skin is warm and dry.     Capillary Refill: Capillary refill takes less than 2 seconds.     Findings: No rash.  Neurological:     Mental Status: She is alert and oriented to person, place, and time.  Psychiatric:        Mood and Affect: Mood normal.        Behavior: Behavior normal.     Wt Readings from Last 3 Encounters:  02/01/21 254 lb (115.2 kg)  08/03/20 254 lb (115.2 kg)  02/04/20 251 lb (113.9 kg)    BP 132/86   Pulse 78   Temp 98 F (36.7 C) (Oral)   Ht 5\' 8"  (1.727 m)   Wt 254 lb (115.2 kg)   SpO2 100%   BMI 38.62 kg/m   Assessment and Plan: 1. Essential (primary) hypertension Clinically stable exam with well controlled BP on three agents. Tolerating medications without side effects at this time. Pt to continue current regimen and low sodium diet; benefits of regular exercise as able discussed.  Partially dictated using . Any errors are unintentional.  Animal nutritionist, MD [facility]  02/01/2021

## 2021-02-12 ENCOUNTER — Other Ambulatory Visit: Payer: Self-pay | Admitting: Internal Medicine

## 2021-02-12 DIAGNOSIS — I10 Essential (primary) hypertension: Secondary | ICD-10-CM

## 2021-02-12 NOTE — Telephone Encounter (Signed)
Requested Prescriptions  Pending Prescriptions Disp Refills  . amLODipine (NORVASC) 5 MG tablet [Pharmacy Med Name: AMLODIPINE BESYLATE 5 MG TAB] 90 tablet 1    Sig: TAKE 1 TABLET BY MOUTH EVERY DAY     Cardiovascular:  Calcium Channel Blockers Passed - 02/12/2021  5:09 PM      Passed - Last BP in normal range    BP Readings from Last 1 Encounters:  02/01/21 132/86         Passed - Valid encounter within last 6 months    Recent Outpatient Visits          1 week ago Essential (primary) hypertension   [facility] Reubin Milan, MD   6 months ago Annual physical exam   [facility] Reubin Milan, MD   1 year ago Essential (primary) hypertension   [facility] Reubin Milan, MD   1 year ago Annual physical exam   [facility] Reubin Milan, MD   2 years ago Annual physical exam   [facility] Reubin Milan, MD      Future Appointments            In 5 months Judithann Graves Nyoka Cowden, MD [facility], [facility]

## 2021-04-28 ENCOUNTER — Other Ambulatory Visit: Payer: Self-pay | Admitting: Internal Medicine

## 2021-05-09 ENCOUNTER — Telehealth: Payer: Self-pay | Admitting: Internal Medicine

## 2021-05-09 DIAGNOSIS — I10 Essential (primary) hypertension: Secondary | ICD-10-CM

## 2021-05-09 MED ORDER — LOSARTAN POTASSIUM 100 MG PO TABS: 100.0000 mg | ORAL_TABLET | Freq: Every day | ORAL | 0 refills | Status: DC

## 2021-05-09 NOTE — Telephone Encounter (Signed)
Medication Refill - Medication: Losartan  Has the patient contacted their pharmacy? Yes.   Pt states that the medication has been expired and that they are needing to have a new prescription. Please advise.  (Agent: If no, request that the patient contact the pharmacy for the refill.) (Agent: If yes, when and what did the pharmacy advise?)  Preferred Pharmacy (with phone number or street name):  [phone] [address]  [address] [place] [address]  Phone: [phone] Fax: [phone]  Hours: Not open 24 hours    Agent: Please be advised that RX refills may take up to 3 business days. We ask that you follow-up with your pharmacy.

## 2021-05-09 NOTE — Telephone Encounter (Signed)
  Notes to clinic:  medication requested has expired  Review for new script    Requested Prescriptions  Pending Prescriptions Disp Refills   losartan (COZAAR) 100 MG tablet 30 tablet 1    Sig: Take 1 tablet (100 mg total) by mouth daily.      Cardiovascular:  Angiotensin Receptor Blockers Failed - 05/09/2021 12:03 PM      Failed - Cr in normal range and within 180 days    Creatinine, Ser  Date Value Ref Range Status  08/03/2020 0.92 0.57 - 1.00 mg/dL Final          Failed - K in normal range and within 180 days    Potassium  Date Value Ref Range Status  08/03/2020 4.0 3.5 - 5.2 mmol/L Final          Passed - Patient is not pregnant      Passed - Last BP in normal range    BP Readings from Last 1 Encounters:  02/01/21 132/86          Passed - Valid encounter within last 6 months    Recent Outpatient Visits           3 months ago Essential (primary) hypertension   [facility] Reubin Milan, MD   9 months ago Annual physical exam   [facility] Reubin Milan, MD   1 year ago Essential (primary) hypertension   [facility] Reubin Milan, MD   1 year ago Annual physical exam   [facility] Reubin Milan, MD   2 years ago Annual physical exam   [facility] Reubin Milan, MD       Future Appointments             In 2 months Judithann Graves Nyoka Cowden, MD [facility]

## 2021-05-11 ENCOUNTER — Other Ambulatory Visit: Payer: Self-pay

## 2021-05-11 MED ORDER — LOSARTAN POTASSIUM-HCTZ 100-25 MG PO TABS: 1.0000 | ORAL_TABLET | Freq: Every day | ORAL | 0 refills | Status: DC

## 2021-05-11 NOTE — Telephone Encounter (Signed)
Pt states the wrong losartan (COZAAR) 100 MG tablet was filled for her. She states she is on losartan-hydrochlorothiazide (HYZAAR) 100-25 MG tablet   And has been on for years. Pt [facility]   [id number] [place] [place] - [address]  Pt needs a 30 day with refills. (Not 90), her company will pay for. Pt is out of medication and needs asap

## 2021-05-11 NOTE — Telephone Encounter (Signed)
Corrected Rx and sent in the correct one. Pharmacy requested the 2 medications separately.

## 2021-08-04 ENCOUNTER — Encounter: Payer: 59 | Admitting: Internal Medicine

## 2021-08-07 ENCOUNTER — Encounter: Payer: Self-pay | Admitting: Internal Medicine

## 2021-08-07 ENCOUNTER — Other Ambulatory Visit: Payer: Self-pay

## 2021-08-07 ENCOUNTER — Ambulatory Visit (INDEPENDENT_AMBULATORY_CARE_PROVIDER_SITE_OTHER): Payer: 59 | Admitting: Internal Medicine

## 2021-08-07 VITALS — BP 132/84 | HR 82 | Temp 98.0°F | Ht 68.0 in | Wt 257.0 lb

## 2021-08-07 DIAGNOSIS — Z0000 Encounter for general adult medical examination without abnormal findings: Secondary | ICD-10-CM | POA: Diagnosis not present

## 2021-08-07 DIAGNOSIS — Z6836 Body mass index (BMI) 36.0-36.9, adult: Secondary | ICD-10-CM

## 2021-08-07 DIAGNOSIS — E782 Mixed hyperlipidemia: Secondary | ICD-10-CM

## 2021-08-07 DIAGNOSIS — Z23 Encounter for immunization: Secondary | ICD-10-CM | POA: Diagnosis not present

## 2021-08-07 DIAGNOSIS — Z1231 Encounter for screening mammogram for malignant neoplasm of breast: Secondary | ICD-10-CM | POA: Diagnosis not present

## 2021-08-07 DIAGNOSIS — I10 Essential (primary) hypertension: Secondary | ICD-10-CM | POA: Diagnosis not present

## 2021-08-07 LAB — POCT URINALYSIS DIPSTICK
Bilirubin, UA: NEGATIVE
Blood, UA: NEGATIVE
Glucose, UA: NEGATIVE
Ketones, UA: NEGATIVE
Nitrite, UA: NEGATIVE
Protein, UA: NEGATIVE
Spec Grav, UA: 1.01 (ref 1.010–1.025)
Urobilinogen, UA: 0.2 E.U./dL
pH, UA: 7 (ref 5.0–8.0)

## 2021-08-07 MED ORDER — AMLODIPINE BESYLATE 5 MG PO TABS: 5.0000 mg | ORAL_TABLET | Freq: Every day | ORAL | 5 refills | Status: DC

## 2021-08-07 MED ORDER — LOSARTAN POTASSIUM-HCTZ 100-25 MG PO TABS: 1.0000 | ORAL_TABLET | Freq: Every day | ORAL | 5 refills | Status: DC

## 2021-08-07 NOTE — Progress Notes (Signed)
Date:  08/07/2021   Name:  Cheryl Mosley   DOB:  Oct 29, 1961   MRN:  [id number]   Chief Complaint: Annual Exam (Breast exam no pap ) Cheryl Mosley is a 60 y.o. female who presents today for her Complete Annual Exam. She feels well. She reports exercising none. She reports she is sleeping well. Breast complaints none.  Mammogram: 11/2020 [facility] DEXA: none Pap smear: discontinued Colonoscopy: 09/2013 repeat 2024  Immunization History  Administered Date(s) Administered   Influenza,inj,Quad PF,6+ Mos 08/29/2015, 08/06/2016, 07/24/2017, 07/25/2018, 07/31/2019   PFIZER Comirnaty(Gray Top)Covid-19 Tri-Sucrose Vaccine 06/02/2021   PFIZER(Purple Top)SARS-COV-2 Vaccination 01/09/2020, 02/02/2020, 08/27/2020   Tdap 06/28/2014    Hypertension This is a chronic problem. The problem is controlled. Pertinent negatives include no chest pain, headaches, palpitations or shortness of breath. Past treatments include diuretics, calcium channel blockers and angiotensin blockers. There are no compliance problems.  There is no history of kidney disease or CAD/MI.   Lab Results  Component Value Date   CREATININE 0.92 08/03/2020   BUN 11 08/03/2020   NA 140 08/03/2020   K 4.0 08/03/2020   CL 98 08/03/2020   CO2 28 08/03/2020   Lab Results  Component Value Date   CHOL 204 (H) 08/03/2020   HDL 58 08/03/2020   LDLCALC 131 (H) 08/03/2020   TRIG 83 08/03/2020   CHOLHDL 3.5 08/03/2020   Lab Results  Component Value Date   TSH 2.580 08/03/2020   No results found for: HGBA1C Lab Results  Component Value Date   WBC 4.1 08/03/2020   HGB 13.4 08/03/2020   HCT 42.1 08/03/2020   MCV 85 08/03/2020   PLT 243 08/03/2020   Lab Results  Component Value Date   ALT 18 08/03/2020   AST 16 08/03/2020   ALKPHOS 101 08/03/2020   BILITOT 0.8 08/03/2020     Review of Systems  Constitutional:  Negative for chills, fatigue and fever.  HENT:  Negative for congestion, hearing loss, tinnitus,  trouble swallowing and voice change.   Eyes:  Negative for visual disturbance.  Respiratory:  Negative for cough, chest tightness, shortness of breath and wheezing.   Cardiovascular:  Negative for chest pain, palpitations and leg swelling.  Gastrointestinal:  Negative for abdominal pain, constipation, diarrhea and vomiting.  Endocrine: Negative for polydipsia and polyuria.  Genitourinary:  Negative for dysuria, frequency, genital sores, vaginal bleeding and vaginal discharge.  Musculoskeletal:  Negative for arthralgias, gait problem and joint swelling.  Skin:  Negative for color change and rash.  Neurological:  Negative for dizziness, tremors, light-headedness and headaches.  Hematological:  Negative for adenopathy. Does not bruise/bleed easily.  Psychiatric/Behavioral:  Negative for dysphoric mood and sleep disturbance. The patient is not nervous/anxious.    Patient Active Problem List   Diagnosis Date Noted   BMI 36.0-36.9,adult 07/25/2018   Cyst of right breast 02/06/2018   Essential (primary) hypertension 06/16/2015   Hyperlipidemia, mixed 06/16/2015   Arthropathy of temporomandibular joint 06/16/2015   Hx of gastritis 06/14/2015    No Known Allergies  Past Surgical History:  Procedure Laterality Date   CHOLECYSTECTOMY N/A 10/27/2015   Procedure: LAPAROSCOPIC CHOLECYSTECTOMY WITH INTRAOPERATIVE CHOLANGIOGRAM;  Surgeon: Ricarda Frame, MD;  Location: [facility];  Service: General;  Laterality: N/A;   COLONOSCOPY  2015   normal- cleared for 10 yrs- Dr Servando Snare   ESOPHAGOGASTRODUODENOSCOPY (EGD) WITH PROPOFOL N/A 09/19/2015   Procedure: ESOPHAGOGASTRODUODENOSCOPY (EGD) WITH PROPOFOL;  Surgeon: Midge Minium, MD;  Location: [facility];  Service: Endoscopy;  Laterality: N/A;   VAGINAL HYSTERECTOMY  2005   partial    Social History   Tobacco Use   Smoking status: Never   Smokeless tobacco: Never  Vaping Use   Vaping Use: Never used  Substance Use Topics   Alcohol use: No     Alcohol/week: 0.0 standard drinks   Drug use: No     Medication list has been reviewed and updated.  Current Meds  Medication Sig   amLODipine (NORVASC) 5 MG tablet TAKE 1 TABLET BY MOUTH EVERY DAY   losartan-hydrochlorothiazide (HYZAAR) 100-25 MG tablet Take 1 tablet by mouth daily.    PHQ 2/9 Scores 08/07/2021 02/01/2021 08/03/2020 02/04/2020  PHQ - 2 Score 0 0 0 0  PHQ- 9 Score 0 0 0 0    GAD 7 : Generalized Anxiety Score 08/07/2021 02/01/2021 08/03/2020 02/04/2020  Nervous, Anxious, on Edge 0 0 0 0  Control/stop worrying 0 0 0 0  Worry too much - different things 0 0 0 0  Trouble relaxing 0 0 0 0  Restless 0 0 0 0  Easily annoyed or irritable 0 0 0 0  Afraid - awful might happen 0 0 0 0  Total GAD 7 Score 0 0 0 0  Anxiety Difficulty - - Not difficult at all Not difficult at all    BP Readings from Last 3 Encounters:  08/07/21 132/84  02/01/21 132/86  08/03/20 128/78    Physical Exam Vitals and nursing note reviewed.  Constitutional:      General: She is not in acute distress.    Appearance: She is well-developed.  HENT:     Head: Normocephalic and atraumatic.     Right Ear: Tympanic membrane and ear canal normal.     Left Ear: Tympanic membrane and ear canal normal.     Nose:     Right Sinus: No maxillary sinus tenderness.     Left Sinus: No maxillary sinus tenderness.  Eyes:     General: No scleral icterus.       Right eye: No discharge.        Left eye: No discharge.     Conjunctiva/sclera: Conjunctivae normal.  Neck:     Thyroid: No thyromegaly.     Vascular: No carotid bruit.  Cardiovascular:     Rate and Rhythm: Normal rate and regular rhythm.     Pulses: Normal pulses.     Heart sounds: Normal heart sounds.  Pulmonary:     Effort: Pulmonary effort is normal. No respiratory distress.     Breath sounds: No wheezing.  Chest:  Breasts:    Right: No mass, nipple discharge, skin change or tenderness.     Left: No mass, nipple discharge, skin change or  tenderness.  Abdominal:     General: Bowel sounds are normal.     Palpations: Abdomen is soft.     Tenderness: There is no abdominal tenderness.  Musculoskeletal:     Cervical back: Normal range of motion. No erythema.     Right lower leg: No edema.     Left lower leg: No edema.  Lymphadenopathy:     Cervical: No cervical adenopathy.  Skin:    General: Skin is warm and dry.     Findings: No rash.  Neurological:     Mental Status: She is alert and oriented to person, place, and time.     Cranial Nerves: No cranial nerve deficit.     Sensory: No sensory deficit.  Deep Tendon Reflexes: Reflexes are normal and symmetric.  Psychiatric:        Attention and Perception: Attention normal.        Mood and Affect: Mood normal.    Wt Readings from Last 3 Encounters:  08/07/21 257 lb (116.6 kg)  02/01/21 254 lb (115.2 kg)  08/03/20 254 lb (115.2 kg)    BP 132/84   Pulse 82   Temp 98 F (36.7 C) (Oral)   Ht 5\' 8"  (1.727 m)   Wt 257 lb (116.6 kg)   SpO2 98%   BMI 39.08 kg/m   Assessment and Plan: 1. Annual physical exam Exam is normal except for weight. Encourage regular exercise and appropriate dietary changes. Up to date on screenings and immunizations  2. Encounter for screening mammogram for breast cancer Schedule in Feb. - MM 3D SCREEN BREAST BILATERAL  3. Essential (primary) hypertension Clinically stable exam with well controlled BP. Tolerating medications without side effects at this time. Pt to continue current regimen and low sodium diet; benefits of regular exercise as able discussed. - CBC with Differential/Platelet - Comprehensive metabolic panel - TSH - POCT urinalysis dipstick - amLODipine (NORVASC) 5 MG tablet; Take 1 tablet (5 mg total) by mouth daily.  Dispense: 30 tablet; Refill: 5 - losartan-hydrochlorothiazide (HYZAAR) 100-25 MG tablet; Take 1 tablet by mouth daily.  Dispense: 30 tablet; Refill: 5  4. Hyperlipidemia, mixed Check labs and  advise - Lipid panel  5. BMI 36.0-36.9,adult Continue to work on diet and exercise to avoid DM - Hemoglobin A1c  6. Need for shingles vaccine First dose today Schedule next dose 2-6 months from today - Varicella-zoster vaccine IM   Partially dictated using 10-16-1969. Any errors are unintentional.  Animal nutritionist, MD [facility]  08/07/2021

## 2021-08-08 LAB — HEMOGLOBIN A1C
Est. average glucose Bld gHb Est-mCnc: 120 mg/dL
Hgb A1c MFr Bld: 5.8 % — ABNORMAL HIGH (ref 4.8–5.6)

## 2021-08-08 LAB — COMPREHENSIVE METABOLIC PANEL
ALT: 18 IU/L (ref 0–32)
AST: 21 IU/L (ref 0–40)
Albumin/Globulin Ratio: 1.3 (ref 1.2–2.2)
Albumin: 4.3 g/dL (ref 3.8–4.9)
Alkaline Phosphatase: 113 IU/L (ref 44–121)
BUN/Creatinine Ratio: 13 (ref 9–23)
BUN: 11 mg/dL (ref 6–24)
Bilirubin Total: 1.4 mg/dL — ABNORMAL HIGH (ref 0.0–1.2)
CO2: 24 mmol/L (ref 20–29)
Calcium: 9.9 mg/dL (ref 8.7–10.2)
Chloride: 100 mmol/L (ref 96–106)
Creatinine, Ser: 0.83 mg/dL (ref 0.57–1.00)
Globulin, Total: 3.3 g/dL (ref 1.5–4.5)
Glucose: 92 mg/dL (ref 70–99)
Potassium: 4.1 mmol/L (ref 3.5–5.2)
Sodium: 138 mmol/L (ref 134–144)
Total Protein: 7.6 g/dL (ref 6.0–8.5)
eGFR: 81 mL/min/{1.73_m2} (ref >59)

## 2021-08-08 LAB — CBC WITH DIFFERENTIAL/PLATELET
Basophils Absolute: 0 10*3/uL (ref 0.0–0.2)
Basos: 1 %
EOS (ABSOLUTE): 0.1 10*3/uL (ref 0.0–0.4)
Eos: 2 %
Hematocrit: 41.5 % (ref 34.0–46.6)
Hemoglobin: 13.6 g/dL (ref 11.1–15.9)
Immature Grans (Abs): 0 10*3/uL (ref 0.0–0.1)
Immature Granulocytes: 0 %
Lymphocytes Absolute: 1.7 10*3/uL (ref 0.7–3.1)
Lymphs: 34 %
MCH: 27.4 pg (ref 26.6–33.0)
MCHC: 32.8 g/dL (ref 31.5–35.7)
MCV: 84 fL (ref 79–97)
Monocytes Absolute: 0.5 10*3/uL (ref 0.1–0.9)
Monocytes: 9 %
Neutrophils Absolute: 2.7 10*3/uL (ref 1.4–7.0)
Neutrophils: 54 %
Platelets: 187 10*3/uL (ref 150–450)
RBC: 4.97 x10E6/uL (ref 3.77–5.28)
RDW: 13.6 % (ref 11.7–15.4)
WBC: 5 10*3/uL (ref 3.4–10.8)

## 2021-08-08 LAB — TSH: TSH: 2.8 u[IU]/mL (ref 0.450–4.500)

## 2021-08-08 LAB — LIPID PANEL
Chol/HDL Ratio: 3.6 ratio (ref 0.0–4.4)
Cholesterol, Total: 201 mg/dL — ABNORMAL HIGH (ref 100–199)
HDL: 56 mg/dL (ref >39)
LDL Chol Calc (NIH): 128 mg/dL — ABNORMAL HIGH (ref 0–99)
Triglycerides: 97 mg/dL (ref 0–149)
VLDL Cholesterol Cal: 17 mg/dL (ref 5–40)

## 2021-09-19 ENCOUNTER — Other Ambulatory Visit: Payer: Self-pay | Admitting: Internal Medicine

## 2021-09-19 DIAGNOSIS — I10 Essential (primary) hypertension: Secondary | ICD-10-CM

## 2021-09-19 NOTE — Telephone Encounter (Signed)
Rx refilled 08/07/2021 #30 with 5 refills.

## 2021-09-20 NOTE — Telephone Encounter (Signed)
Verified with pharmacy the patient has multiple refills on file. This is a duplicate and not needed.

## 2021-12-06 ENCOUNTER — Other Ambulatory Visit: Payer: Self-pay

## 2021-12-06 ENCOUNTER — Ambulatory Visit
Admission: RE | Admit: 2021-12-06 | Discharge: 2021-12-06 | Disposition: A | Discharge status: Home / Self Care | Payer: 59 | Source: Ambulatory Visit | Attending: Internal Medicine | Admitting: Internal Medicine

## 2021-12-06 DIAGNOSIS — Z1231 Encounter for screening mammogram for malignant neoplasm of breast: Secondary | ICD-10-CM | POA: Insufficient documentation

## 2021-12-07 ENCOUNTER — Other Ambulatory Visit: Payer: Self-pay | Admitting: Internal Medicine

## 2021-12-07 DIAGNOSIS — N6489 Other specified disorders of breast: Secondary | ICD-10-CM

## 2021-12-07 DIAGNOSIS — R928 Other abnormal and inconclusive findings on diagnostic imaging of breast: Secondary | ICD-10-CM

## 2021-12-12 ENCOUNTER — Telehealth: Payer: Self-pay | Admitting: Internal Medicine

## 2021-12-12 NOTE — Telephone Encounter (Signed)
Patient called in, wans to speak wit nurse about mamogram results. Please call back

## 2021-12-12 NOTE — Telephone Encounter (Signed)
Informed pt that she needed additional imaging. Told pt Dr. Judithann Graves has ordered it. Gave pt phone number to call to schedule [phone].  KP

## 2021-12-27 ENCOUNTER — Other Ambulatory Visit: Payer: 59

## 2021-12-28 ENCOUNTER — Ambulatory Visit
Admission: RE | Admit: 2021-12-28 | Discharge: 2021-12-28 | Disposition: A | Discharge status: Home / Self Care | Payer: 59 | Source: Ambulatory Visit | Attending: Internal Medicine | Admitting: Internal Medicine

## 2021-12-28 ENCOUNTER — Other Ambulatory Visit: Payer: Self-pay

## 2021-12-28 DIAGNOSIS — R928 Other abnormal and inconclusive findings on diagnostic imaging of breast: Secondary | ICD-10-CM | POA: Insufficient documentation

## 2021-12-28 DIAGNOSIS — N6489 Other specified disorders of breast: Secondary | ICD-10-CM | POA: Diagnosis present

## 2022-01-31 DIAGNOSIS — Z90710 Acquired absence of both cervix and uterus: Secondary | ICD-10-CM | POA: Insufficient documentation

## 2022-02-05 ENCOUNTER — Ambulatory Visit (INDEPENDENT_AMBULATORY_CARE_PROVIDER_SITE_OTHER): Payer: 59 | Admitting: Internal Medicine

## 2022-02-05 ENCOUNTER — Encounter: Payer: Self-pay | Admitting: Internal Medicine

## 2022-02-05 VITALS — BP 106/78 | HR 78 | Ht 68.0 in | Wt 250.6 lb

## 2022-02-05 DIAGNOSIS — R7303 Prediabetes: Secondary | ICD-10-CM | POA: Diagnosis not present

## 2022-02-05 DIAGNOSIS — Z23 Encounter for immunization: Secondary | ICD-10-CM

## 2022-02-05 DIAGNOSIS — I10 Essential (primary) hypertension: Secondary | ICD-10-CM | POA: Diagnosis not present

## 2022-02-05 MED ORDER — AMLODIPINE BESYLATE 5 MG PO TABS: 5.0000 mg | ORAL_TABLET | Freq: Every day | ORAL | 5 refills | Status: DC

## 2022-02-05 MED ORDER — LOSARTAN POTASSIUM-HCTZ 100-25 MG PO TABS: 1.0000 | ORAL_TABLET | Freq: Every day | ORAL | 5 refills | Status: DC

## 2022-02-05 NOTE — Progress Notes (Signed)
? ? ?Date:  02/05/2022  ? ?Name:  Cheryl Mosley   DOB:  1961-02-13   MRN:  [id number] ? ? ?Chief Complaint: Hypertension and Diabetes ? ?Hypertension ?This is a chronic problem. The problem is controlled (she does not check BP at home). Pertinent negatives include no chest pain, headaches, palpitations or shortness of breath. Past treatments include calcium channel blockers, diuretics and angiotensin blockers. The current treatment provides significant improvement. There are no compliance problems.  There is no history of kidney disease, CAD/MI or CVA.  ?Diabetes ?She presents for her follow-up diabetic visit. Diabetes type: prediabetes. Pertinent negatives for hypoglycemia include no dizziness or headaches. Pertinent negatives for diabetes include no chest pain, no fatigue and no weakness. Pertinent negatives for diabetic complications include no CVA. Current diabetic treatment includes diet. Her weight is decreasing steadily (has lost 8 lbs since last visit).  ? ?Lab Results  ?Component Value Date  ? NA 138 08/07/2021  ? K 4.1 08/07/2021  ? CO2 24 08/07/2021  ? GLUCOSE 92 08/07/2021  ? BUN 11 08/07/2021  ? CREATININE 0.83 08/07/2021  ? CALCIUM 9.9 08/07/2021  ? EGFR 81 08/07/2021  ? GFRNONAA 69 08/03/2020  ? ?Lab Results  ?Component Value Date  ? CHOL 201 (H) 08/07/2021  ? HDL 56 08/07/2021  ? LDLCALC 128 (H) 08/07/2021  ? TRIG 97 08/07/2021  ? CHOLHDL 3.6 08/07/2021  ? ?Lab Results  ?Component Value Date  ? TSH 2.800 08/07/2021  ? ?Lab Results  ?Component Value Date  ? HGBA1C 5.8 (H) 08/07/2021  ? ?Lab Results  ?Component Value Date  ? WBC 5.0 08/07/2021  ? HGB 13.6 08/07/2021  ? HCT 41.5 08/07/2021  ? MCV 84 08/07/2021  ? PLT 187 08/07/2021  ? ?Lab Results  ?Component Value Date  ? ALT 18 08/07/2021  ? AST 21 08/07/2021  ? ALKPHOS 113 08/07/2021  ? BILITOT 1.4 (H) 08/07/2021  ? ?No results found for: 25OHVITD2, [place], VD25OH  ? ?Review of Systems  ?Constitutional:  Negative for fatigue and unexpected weight  change.  ?HENT:  Negative for nosebleeds.   ?Eyes:  Negative for visual disturbance.  ?Respiratory:  Negative for cough, chest tightness, shortness of breath and wheezing.   ?Cardiovascular:  Negative for chest pain, palpitations and leg swelling.  ?Gastrointestinal:  Negative for abdominal pain, constipation and diarrhea.  ?Neurological:  Negative for dizziness, weakness, light-headedness and headaches.  ? ?Patient Active Problem List  ? Diagnosis Date Noted  ? Status post total hysterectomy 01/31/2022  ? BMI 36.0-36.9,adult 07/25/2018  ? Cyst of right breast 02/06/2018  ? Essential (primary) hypertension 06/16/2015  ? Hyperlipidemia, mixed 06/16/2015  ? Hx of gastritis 06/14/2015  ? ? ?No Known Allergies ? ?Past Surgical History:  ?Procedure Laterality Date  ? CHOLECYSTECTOMY N/A 10/27/2015  ? Procedure: LAPAROSCOPIC CHOLECYSTECTOMY WITH INTRAOPERATIVE CHOLANGIOGRAM;  Surgeon: Clayburn Pert, MD;  Location: [facility];  Service: General;  Laterality: N/A;  ? COLONOSCOPY  10/29/2013  ? normal- cleared for 10 yrs- Dr Allen Norris  ? ESOPHAGOGASTRODUODENOSCOPY (EGD) WITH PROPOFOL N/A 09/19/2015  ? Procedure: ESOPHAGOGASTRODUODENOSCOPY (EGD) WITH PROPOFOL;  Surgeon: Lucilla Lame, MD;  Location: [place];  Service: Endoscopy;  Laterality: N/A;  ? TOTAL ABDOMINAL HYSTERECTOMY  10/30/2003  ? ? ?Social History  ? ?Tobacco Use  ? Smoking status: Never  ? Smokeless tobacco: Never  ?Vaping Use  ? Vaping Use: Never used  ?Substance Use Topics  ? Alcohol use: No  ?  Alcohol/week: 0.0 standard drinks  ? Drug use:  No  ? ? ? ?Medication list has been reviewed and updated. ? ?Current Meds  ?Medication Sig  ? amLODipine (NORVASC) 5 MG tablet Take 1 tablet (5 mg total) by mouth daily.  ? losartan-hydrochlorothiazide (HYZAAR) 100-25 MG tablet Take 1 tablet by mouth daily.  ? ? ? ?  02/05/2022  ?  9:44 AM 08/07/2021  ?  9:24 AM 02/01/2021  ?  8:21 AM 08/03/2020  ?  8:37 AM  ?GAD 7 : Generalized Anxiety Score  ?Nervous, Anxious, on Edge 0  0 0 0  ?Control/stop worrying 0 0 0 0  ?Worry too much - different things 0 0 0 0  ?Trouble relaxing 0 0 0 0  ?Restless 0 0 0 0  ?Easily annoyed or irritable 0 0 0 0  ?Afraid - awful might happen 0 0 0 0  ?Total GAD 7 Score 0 0 0 0  ?Anxiety Difficulty Not difficult at all   Not difficult at all  ? ? ? ?  02/05/2022  ?  9:44 AM  ?Depression screen PHQ 2/9  ?Decreased Interest 0  ?Down, Depressed, Hopeless 0  ?PHQ - 2 Score 0  ?Altered sleeping 0  ?Tired, decreased energy 0  ?Change in appetite 0  ?Feeling bad or failure about yourself  0  ?Trouble concentrating 0  ?Moving slowly or fidgety/restless 0  ?Suicidal thoughts 0  ?PHQ-9 Score 0  ?Difficult doing work/chores Not difficult at all  ? ? ?BP Readings from Last 3 Encounters:  ?02/05/22 106/78  ?08/07/21 132/84  ?02/01/21 132/86  ? ? ?Physical Exam ?Vitals and nursing note reviewed.  ?Constitutional:   ?   General: She is not in acute distress. ?   Appearance: She is well-developed.  ?HENT:  ?   Head: Normocephalic and atraumatic.  ?Cardiovascular:  ?   Rate and Rhythm: Normal rate and regular rhythm.  ?   Pulses: Normal pulses.  ?   Heart sounds: No murmur heard. ?Pulmonary:  ?   Effort: Pulmonary effort is normal. No respiratory distress.  ?   Breath sounds: No wheezing or rhonchi.  ?Musculoskeletal:  ?   Cervical back: Normal range of motion.  ?   Right lower leg: No edema.  ?   Left lower leg: No edema.  ?Lymphadenopathy:  ?   Cervical: No cervical adenopathy.  ?Skin: ?   General: Skin is warm and dry.  ?   Capillary Refill: Capillary refill takes less than 2 seconds.  ?   Findings: No rash.  ?Neurological:  ?   General: No focal deficit present.  ?   Mental Status: She is alert and oriented to person, place, and time.  ?Psychiatric:     ?   Mood and Affect: Mood normal.     ?   Behavior: Behavior normal.  ? ? ?Wt Readings from Last 3 Encounters:  ?02/05/22 250 lb 9.6 oz (113.7 kg)  ?08/07/21 257 lb (116.6 kg)  ?02/01/21 254 lb (115.2 kg)  ? ? ?BP 106/78    Pulse 78   Ht $R'5\' 8"'sl$  (1.727 m)   Wt 250 lb 9.6 oz (113.7 kg)   SpO2 98%   BMI 38.10 kg/m?  ? ?Assessment and Plan: ?1. Essential (primary) hypertension ?Clinically stable exam with well controlled BP. ?Tolerating medications without side effects at this time. ?Pt to continue current regimen and low sodium diet; benefits of regular exercise as able discussed. ?Recommend monitoring at home and will consider reducing medications at next visit ?- amLODipine (NORVASC)  5 MG tablet; Take 1 tablet (5 mg total) by mouth daily.  Dispense: 30 tablet; Refill: 5 ?- losartan-hydrochlorothiazide (HYZAAR) 100-25 MG tablet; Take 1 tablet by mouth daily.  Dispense: 30 tablet; Refill: 5 ? ?2. Prediabetes ?Continue with low carb diet and weight loss ? ?3. Need for shingles vaccine ?Second dose today ?- Varicella-zoster vaccine IM ? ? ?Partially dictated using Editor, commissioning. Any errors are unintentional. ? ?Halina Maidens, MD ?[facility] ?[facility] ? ?02/05/2022 ? ? ? ? ? ?

## 2022-02-05 NOTE — Patient Instructions (Addendum)
Goal BP <130/80 ? ?Recombinant Zoster (Shingles) Vaccine: What You Need to Know ?1. Why get vaccinated? ?Recombinant zoster (shingles) vaccine can prevent shingles. ?Shingles (also called herpes zoster, or just zoster) is a painful skin rash, usually with blisters. In addition to the rash, shingles can cause fever, headache, chills, or upset stomach. Rarely, shingles can lead to complications such as pneumonia, hearing problems, blindness, brain inflammation (encephalitis), or death. ?The risk of shingles increases with age. The most common complication of shingles is long-term nerve pain called postherpetic neuralgia (PHN). PHN occurs in the areas where the shingles rash was and can last for months or years after the rash goes away. The pain from PHN can be severe and debilitating. ?The risk of PHN increases with age. An older adult with shingles is more likely to develop PHN and have longer lasting and more severe pain than a younger person. ?People with weakened immune systems also have a higher risk of getting shingles and complications from the disease. ?Shingles is caused by varicella-zoster virus, the same virus that causes chickenpox. After you have chickenpox, the virus stays in your body and can cause shingles later in life. Shingles cannot be passed from one person to another, but the virus that causes shingles can spread and cause chickenpox in someone who has never had chickenpox or has never received chickenpox vaccine. ?2. Recombinant shingles vaccine ?Recombinant shingles vaccine provides strong protection against shingles. By preventing shingles, recombinant shingles vaccine also protects against PHN and other complications. ?Recombinant shingles vaccine is recommended for: ?Adults 50 years and older ?Adults 19 years and older who have a weakened immune system because of disease or treatments ?Shingles vaccine is given as a two-dose series. For most people, the second dose should be given 2 to 6  months after the first dose. Some people who have or will have a weakened immune system can get the second dose 1 to 2 months after the first dose. Ask your health care provider for guidance. ?People who have had shingles in the past and people who have received varicella (chickenpox) vaccine are recommended to get recombinant shingles vaccine. The vaccine is also recommended for people who have already gotten another type of shingles vaccine, the live shingles vaccine. There is no live virus in recombinant shingles vaccine. ?Shingles vaccine may be given at the same time as other vaccines. ?3. Talk with your health care provider ?Tell your vaccination provider if the person getting the vaccine: ?Has had an allergic reaction after a previous dose of recombinant shingles vaccine, or has any severe, life-threatening allergies ?Is currently experiencing an episode of shingles ?Is pregnant ?In some cases, your health care provider may decide to postpone shingles vaccination until a future visit. ?People with minor illnesses, such as a cold, may be vaccinated. People who are moderately or severely ill should usually wait until they recover before getting recombinant shingles vaccine. ?Your health care provider can give you more information. ?4. Risks of a vaccine reaction ?A sore arm with mild or moderate pain is very common after recombinant shingles vaccine. Redness and swelling can also happen at the site of the injection. ?Tiredness, muscle pain, headache, shivering, fever, stomach pain, and nausea are common after recombinant shingles vaccine. ?These side effects may temporarily prevent a vaccinated person from doing regular activities. Symptoms usually go away on their own in 2 to 3 days. You should still get the second dose of recombinant shingles vaccine even if you had one of these reactions after  the first dose. ?Guillain-Barr? syndrome (GBS), a serious nervous system disorder, has been reported very rarely  after recombinant zoster vaccine. ?People sometimes faint after medical procedures, including vaccination. Tell your provider if you feel dizzy or have vision changes or ringing in the ears. ?As with any medicine, there is a very remote chance of a vaccine causing a severe allergic reaction, other serious injury, or death. ?5. What if there is a serious problem? ?An allergic reaction could occur after the vaccinated person leaves the clinic. If you see signs of a severe allergic reaction (hives, swelling of the face and throat, difficulty breathing, a fast heartbeat, dizziness, or weakness), call 9-1-1 and get the person to the nearest hospital. ?For other signs that concern you, call your health care provider. ?Adverse reactions should be reported to the Vaccine Adverse Event Reporting System (VAERS). Your health care provider will usually file this report, or you can do it yourself. Visit the VAERS website at www.vaers.LAgents.no or call 8203707536. VAERS is only for reporting reactions, and VAERS staff members do not give medical advice. ?6. How can I learn more? ?Ask your health care provider. ?Call your local or state health department. ?Visit the website of the Food and Drug Administration (FDA) for vaccine package inserts and additional information at GoldCloset.com.ee. ?Contact the Centers for Disease Control and Prevention (CDC): ?Call (401)596-3597 (1-800-CDC-INFO) or ?Visit CDC's website at PicCapture.uy. ?Vaccine Information Statement Recombinant Zoster Vaccine (12/02/2020) ?This information is not intended to replace advice given to you by your health care provider. Make sure you discuss any questions you have with your health care provider. ?Document Revised: 06/30/2021 Document Reviewed: 12/16/2020 ?Elsevier Patient Education ? 2022 [employer]vier Inc. ? ?

## 2022-02-07 ENCOUNTER — Ambulatory Visit: Payer: 59 | Admitting: Internal Medicine

## 2022-08-08 ENCOUNTER — Encounter: Payer: Self-pay | Admitting: Internal Medicine

## 2022-08-08 ENCOUNTER — Ambulatory Visit (INDEPENDENT_AMBULATORY_CARE_PROVIDER_SITE_OTHER): Payer: 59 | Admitting: Internal Medicine

## 2022-08-08 VITALS — BP 160/102 | HR 75 | Ht 68.0 in | Wt 256.0 lb

## 2022-08-08 DIAGNOSIS — Z131 Encounter for screening for diabetes mellitus: Secondary | ICD-10-CM | POA: Diagnosis not present

## 2022-08-08 DIAGNOSIS — Z0000 Encounter for general adult medical examination without abnormal findings: Secondary | ICD-10-CM | POA: Diagnosis not present

## 2022-08-08 DIAGNOSIS — Z1231 Encounter for screening mammogram for malignant neoplasm of breast: Secondary | ICD-10-CM

## 2022-08-08 DIAGNOSIS — Z23 Encounter for immunization: Secondary | ICD-10-CM

## 2022-08-08 DIAGNOSIS — I10 Essential (primary) hypertension: Secondary | ICD-10-CM | POA: Diagnosis not present

## 2022-08-08 DIAGNOSIS — Z1211 Encounter for screening for malignant neoplasm of colon: Secondary | ICD-10-CM

## 2022-08-08 DIAGNOSIS — E782 Mixed hyperlipidemia: Secondary | ICD-10-CM

## 2022-08-08 LAB — POCT URINALYSIS DIPSTICK
Bilirubin, UA: NEGATIVE
Blood, UA: NEGATIVE
Glucose, UA: NEGATIVE
Ketones, UA: NEGATIVE
Nitrite, UA: NEGATIVE
Protein, UA: NEGATIVE
Spec Grav, UA: <=1.005 — AB (ref 1.010–1.025)
Urobilinogen, UA: 0.2 E.U./dL
pH, UA: 5 (ref 5.0–8.0)

## 2022-08-08 MED ORDER — LOSARTAN POTASSIUM-HCTZ 100-25 MG PO TABS: 1.0000 | ORAL_TABLET | Freq: Every day | ORAL | 5 refills | Status: DC

## 2022-08-08 NOTE — Progress Notes (Signed)
Date:  08/08/2022   Name:  Cheryl Mosley   DOB:  1961/06/08   MRN:  [id number]   Chief Complaint: Annual Exam (Breast exam no pap ) Cheryl Mosley is a 61 y.o. female who presents today for her Complete Annual Exam. She feels well. She reports exercising walking sometimes. She reports she is sleeping well. Breast complaints none.  Mammogram: 11/2021 Left simple cyst >> continue annual screening DEXA: none Pap smear: discontinued Colonoscopy: 2014  Health Maintenance Due  Topic Date Due   COVID-19 Vaccine (5 - Pfizer risk series) 07/28/2021   INFLUENZA VACCINE  05/29/2022    Immunization History  Administered Date(s) Administered   Influenza,inj,Quad PF,6+ Mos 08/29/2015, 08/06/2016, 07/24/2017, 07/25/2018, 07/31/2019   PFIZER Comirnaty(Gray Top)Covid-19 Tri-Sucrose Vaccine 06/02/2021   PFIZER(Purple Top)SARS-COV-2 Vaccination 01/09/2020, 02/02/2020, 08/27/2020   Tdap 06/28/2014   Zoster Recombinat (Shingrix) 08/07/2021, 02/05/2022    Hypertension This is a chronic problem. The problem is uncontrolled (140's - 150). Pertinent negatives include no chest pain, headaches, palpitations or shortness of breath. Past treatments include diuretics and angiotensin blockers (stopped amlodipine). The current treatment provides moderate improvement. There is no history of kidney disease, CAD/MI or CVA.  Hyperlipidemia This is a chronic problem. The problem is controlled. Pertinent negatives include no chest pain or shortness of breath. She is currently on no antihyperlipidemic treatment.    Lab Results  Component Value Date   NA 138 08/07/2021   K 4.1 08/07/2021   CO2 24 08/07/2021   GLUCOSE 92 08/07/2021   BUN 11 08/07/2021   CREATININE 0.83 08/07/2021   CALCIUM 9.9 08/07/2021   EGFR 81 08/07/2021   GFRNONAA 69 08/03/2020   Lab Results  Component Value Date   CHOL 201 (H) 08/07/2021   HDL 56 08/07/2021   LDLCALC 128 (H) 08/07/2021   TRIG 97 08/07/2021   CHOLHDL 3.6  08/07/2021   Lab Results  Component Value Date   TSH 2.800 08/07/2021   Lab Results  Component Value Date   HGBA1C 5.8 (H) 08/07/2021   Lab Results  Component Value Date   WBC 5.0 08/07/2021   HGB 13.6 08/07/2021   HCT 41.5 08/07/2021   MCV 84 08/07/2021   PLT 187 08/07/2021   Lab Results  Component Value Date   ALT 18 08/07/2021   AST 21 08/07/2021   ALKPHOS 113 08/07/2021   BILITOT 1.4 (H) 08/07/2021   No results found for: "25OHVITD2", "25OHVITD3", "VD25OH"   Review of Systems  Constitutional:  Negative for chills, fatigue and fever.  HENT:  Negative for congestion, hearing loss, tinnitus, trouble swallowing and voice change.   Eyes:  Negative for visual disturbance.  Respiratory:  Negative for cough, chest tightness, shortness of breath and wheezing.   Cardiovascular:  Negative for chest pain, palpitations and leg swelling.  Gastrointestinal:  Negative for abdominal pain, constipation, diarrhea and vomiting.  Endocrine: Negative for polydipsia and polyuria.  Genitourinary:  Negative for dysuria, frequency, genital sores, vaginal bleeding and vaginal discharge.  Musculoskeletal:  Negative for arthralgias, gait problem and joint swelling.  Skin:  Negative for color change and rash.  Neurological:  Negative for dizziness, tremors, light-headedness and headaches.  Hematological:  Negative for adenopathy. Does not bruise/bleed easily.  Psychiatric/Behavioral:  Negative for dysphoric mood and sleep disturbance. The patient is not nervous/anxious.     Patient Active Problem List   Diagnosis Date Noted   Status post total hysterectomy 01/31/2022   BMI 36.0-36.9,adult 07/25/2018   Cyst of right  breast 02/06/2018   Essential (primary) hypertension 06/16/2015   Hyperlipidemia, mixed 06/16/2015   Hx of gastritis 06/14/2015    No Known Allergies  Past Surgical History:  Procedure Laterality Date   CHOLECYSTECTOMY N/A 10/27/2015   Procedure: LAPAROSCOPIC  CHOLECYSTECTOMY WITH INTRAOPERATIVE CHOLANGIOGRAM;  Surgeon: Clayburn Pert, MD;  Location: [facility];  Service: General;  Laterality: N/A;   COLONOSCOPY  10/29/2013   normal- cleared for 10 yrs- Dr Allen Norris   ESOPHAGOGASTRODUODENOSCOPY (EGD) WITH PROPOFOL N/A 09/19/2015   Procedure: ESOPHAGOGASTRODUODENOSCOPY (EGD) WITH PROPOFOL;  Surgeon: Lucilla Lame, MD;  Location: [place];  Service: Endoscopy;  Laterality: N/A;   TOTAL ABDOMINAL HYSTERECTOMY  10/30/2003    Social History   Tobacco Use   Smoking status: Never   Smokeless tobacco: Never  Vaping Use   Vaping Use: Never used  Substance Use Topics   Alcohol use: No    Alcohol/week: 0.0 standard drinks of alcohol   Drug use: No     Medication list has been reviewed and updated.  Current Meds  Medication Sig   [DISCONTINUED] losartan-hydrochlorothiazide (HYZAAR) 100-25 MG tablet Take 1 tablet by mouth daily.       08/08/2022    8:56 AM 02/05/2022    9:44 AM 08/07/2021    9:24 AM 02/01/2021    8:21 AM  GAD 7 : Generalized Anxiety Score  Nervous, Anxious, on Edge 0 0 0 0  Control/stop worrying 0 0 0 0  Worry too much - different things 1 0 0 0  Trouble relaxing 0 0 0 0  Restless 0 0 0 0  Easily annoyed or irritable 0 0 0 0  Afraid - awful might happen 0 0 0 0  Total GAD 7 Score 1 0 0 0  Anxiety Difficulty Not difficult at all Not difficult at all         08/08/2022    8:56 AM 02/05/2022    9:44 AM 08/07/2021    9:24 AM  Depression screen PHQ 2/9  Decreased Interest 0 0 0  Down, Depressed, Hopeless 0 0 0  PHQ - 2 Score 0 0 0  Altered sleeping 0 0 0  Tired, decreased energy 0 0 0  Change in appetite 0 0 0  Feeling bad or failure about yourself  0 0 0  Trouble concentrating 0 0 0  Moving slowly or fidgety/restless 0 0 0  Suicidal thoughts 0 0 0  PHQ-9 Score 0 0 0  Difficult doing work/chores Not difficult at all Not difficult at all Not difficult at all    BP Readings from Last 3 Encounters:  08/08/22  (!) 150/126  02/05/22 106/78  08/07/21 132/84    Physical Exam Vitals and nursing note reviewed.  Constitutional:      General: She is not in acute distress.    Appearance: She is well-developed.  HENT:     Head: Normocephalic and atraumatic.     Right Ear: Tympanic membrane and ear canal normal.     Left Ear: Tympanic membrane and ear canal normal.     Nose:     Right Sinus: No maxillary sinus tenderness.     Left Sinus: No maxillary sinus tenderness.  Eyes:     General: No scleral icterus.       Right eye: No discharge.        Left eye: No discharge.     Conjunctiva/sclera: Conjunctivae normal.  Neck:     Thyroid: No thyromegaly.     Vascular: No  carotid bruit.  Cardiovascular:     Rate and Rhythm: Normal rate and regular rhythm.     Pulses: Normal pulses.     Heart sounds: Normal heart sounds.  Pulmonary:     Effort: Pulmonary effort is normal. No respiratory distress.     Breath sounds: No wheezing.  Chest:  Breasts:    Right: No mass, nipple discharge, skin change or tenderness.     Left: No mass, nipple discharge, skin change or tenderness.  Abdominal:     General: Bowel sounds are normal.     Palpations: Abdomen is soft.     Tenderness: There is no abdominal tenderness.  Musculoskeletal:     Cervical back: Normal range of motion. No erythema.     Right lower leg: No edema.     Left lower leg: No edema.  Lymphadenopathy:     Cervical: No cervical adenopathy.  Skin:    General: Skin is warm and dry.     Findings: No rash.  Neurological:     Mental Status: She is alert and oriented to person, place, and time.     Cranial Nerves: No cranial nerve deficit.     Sensory: No sensory deficit.     Deep Tendon Reflexes: Reflexes are normal and symmetric.  Psychiatric:        Attention and Perception: Attention normal.        Mood and Affect: Mood normal.     Wt Readings from Last 3 Encounters:  08/08/22 256 lb (116.1 kg)  02/05/22 250 lb 9.6 oz (113.7 kg)   08/07/21 257 lb (116.6 kg)    BP (!) 150/126 (BP Location: Left Arm)   Pulse 75   Ht $R'5\' 8"'av$  (1.727 m)   Wt 256 lb (116.1 kg)   SpO2 97%   BMI 38.92 kg/m   Assessment and Plan: 1. Annual physical exam Exam is normal except for weight and BP. Encourage regular exercise and appropriate dietary changes. Up to date on screenings and immunizations. - CBC with Differential/Platelet - Comprehensive metabolic panel - Hemoglobin A1c - Lipid panel - TSH  2. Screening for diabetes mellitus Continue to work on low carb diet choices - Hemoglobin A1c  3. Essential (primary) hypertension BP is not controlled - she misunderstood and stopped amlodipine after last visit Resume amlodipine 5 mg and continue Hyzaar Recheck in 2 months - CBC with Differential/Platelet - Comprehensive metabolic panel - TSH - POCT urinalysis dipstick - losartan-hydrochlorothiazide (HYZAAR) 100-25 MG tablet; Take 1 tablet by mouth daily.  Dispense: 30 tablet; Refill: 5  4. Hyperlipidemia, mixed Check labs and advise - Lipid panel  5. Encounter for screening mammogram for breast cancer Schedule for 11/2022 - MM 3D SCREEN BREAST BILATERAL  6. Colon cancer screening Due next year - Ambulatory referral to Gastroenterology   Partially dictated using Dragon software. Any errors are unintentional.  Halina Maidens, MD [facility]  08/08/2022

## 2022-08-08 NOTE — Patient Instructions (Signed)
Call [facility] to schedule your mammogram for February at [phone].

## 2022-08-10 ENCOUNTER — Other Ambulatory Visit: Payer: Self-pay | Admitting: *Deleted

## 2022-08-10 ENCOUNTER — Telehealth: Payer: Self-pay | Admitting: *Deleted

## 2022-08-10 DIAGNOSIS — Z1211 Encounter for screening for malignant neoplasm of colon: Secondary | ICD-10-CM

## 2022-08-10 LAB — COMPREHENSIVE METABOLIC PANEL
ALT: 19 IU/L (ref 0–32)
AST: 17 IU/L (ref 0–40)
Albumin/Globulin Ratio: 1.3 (ref 1.2–2.2)
Albumin: 3.9 g/dL (ref 3.8–4.9)
Alkaline Phosphatase: 96 IU/L (ref 44–121)
BUN/Creatinine Ratio: 13 (ref 12–28)
BUN: 12 mg/dL (ref 8–27)
Bilirubin Total: 1.2 mg/dL (ref 0.0–1.2)
CO2: 26 mmol/L (ref 20–29)
Calcium: 9.5 mg/dL (ref 8.7–10.3)
Chloride: 101 mmol/L (ref 96–106)
Creatinine, Ser: 0.95 mg/dL (ref 0.57–1.00)
Globulin, Total: 3.1 g/dL (ref 1.5–4.5)
Glucose: 94 mg/dL (ref 70–99)
Potassium: 3.8 mmol/L (ref 3.5–5.2)
Sodium: 140 mmol/L (ref 134–144)
Total Protein: 7 g/dL (ref 6.0–8.5)
eGFR: 69 mL/min/{1.73_m2} (ref >59)

## 2022-08-10 LAB — CBC WITH DIFFERENTIAL/PLATELET
Basophils Absolute: 0 10*3/uL (ref 0.0–0.2)
Basos: 1 %
EOS (ABSOLUTE): 0.1 10*3/uL (ref 0.0–0.4)
Eos: 4 %
Hematocrit: 38.6 % (ref 34.0–46.6)
Hemoglobin: 13.1 g/dL (ref 11.1–15.9)
Immature Grans (Abs): 0 10*3/uL (ref 0.0–0.1)
Immature Granulocytes: 0 %
Lymphocytes Absolute: 1.3 10*3/uL (ref 0.7–3.1)
Lymphs: 35 %
MCH: 28.4 pg (ref 26.6–33.0)
MCHC: 33.9 g/dL (ref 31.5–35.7)
MCV: 84 fL (ref 79–97)
Monocytes Absolute: 0.6 10*3/uL (ref 0.1–0.9)
Monocytes: 16 %
Neutrophils Absolute: 1.8 10*3/uL (ref 1.4–7.0)
Neutrophils: 44 %
Platelets: 164 10*3/uL (ref 150–450)
RBC: 4.62 x10E6/uL (ref 3.77–5.28)
RDW: 14.4 % (ref 11.7–15.4)
WBC: 3.9 10*3/uL (ref 3.4–10.8)

## 2022-08-10 LAB — TSH: TSH: 4.55 u[IU]/mL — ABNORMAL HIGH (ref 0.450–4.500)

## 2022-08-10 LAB — LIPID PANEL
Chol/HDL Ratio: 3.7 ratio (ref 0.0–4.4)
Cholesterol, Total: 191 mg/dL (ref 100–199)
HDL: 51 mg/dL (ref >39)
LDL Chol Calc (NIH): 126 mg/dL — ABNORMAL HIGH (ref 0–99)
Triglycerides: 75 mg/dL (ref 0–149)
VLDL Cholesterol Cal: 14 mg/dL (ref 5–40)

## 2022-08-10 LAB — HEMOGLOBIN A1C
Est. average glucose Bld gHb Est-mCnc: 123 mg/dL
Hgb A1c MFr Bld: 5.9 % — ABNORMAL HIGH (ref 4.8–5.6)

## 2022-08-10 MED ORDER — NA SULFATE-K SULFATE-MG SULF 17.5-3.13-1.6 GM/177ML PO SOLN: 1.0000 | Freq: Once | ORAL | 0 refills | Status: AC

## 2022-08-10 NOTE — Telephone Encounter (Signed)
Gastroenterology Pre-Procedure Review  Request Date: 12/14/2022 Requesting Physician: Dr. Allen Norris  PATIENT REVIEW QUESTIONS: The patient responded to the following health history questions as indicated:    1. Are you having any GI issues? no 2. Do you have a personal history of Polyps? no 3. Do you have a family history of Colon Cancer or Polyps? no 4. Diabetes Mellitus? no 5. Joint replacements in the past 12 months?no 6. Major health problems in the past 3 months?no 7. Any artificial heart valves, MVP, or defibrillator?no    MEDICATIONS & ALLERGIES:    Patient reports the following regarding taking any anticoagulation/antiplatelet therapy:   Plavix, Coumadin, Eliquis, Xarelto, Lovenox, Pradaxa, Brilinta, or Effient? no Aspirin? no  Patient confirms/reports the following medications:  Current Outpatient Medications  Medication Sig Dispense Refill   losartan-hydrochlorothiazide (HYZAAR) 100-25 MG tablet Take 1 tablet by mouth daily. 30 tablet 5   No current facility-administered medications for this visit.    Patient confirms/reports the following allergies:  No Known Allergies  No orders of the defined types were placed in this encounter.   AUTHORIZATION INFORMATION Primary Insurance: 1D#: Group #:  Secondary Insurance: 1D#: Group #:  SCHEDULE INFORMATION: Date: 12/14/2022 Time: Location: [place]

## 2022-09-18 ENCOUNTER — Telehealth: Payer: Self-pay | Admitting: Internal Medicine

## 2022-09-18 NOTE — Telephone Encounter (Signed)
Copied from CRM [id number]. Topic: Medical Record Request - Other >> Sep 18, 2022  7:42 AM De Blanch wrote: Reason for CRM: Patient calling in to follow up on her request for her medical records. Pt stated she still has not received anything and is requesting a callback today. Pt mentioned that she has requested medical records before, and it has not taken this long.  Please follow up with the patient.

## 2022-10-23 ENCOUNTER — Ambulatory Visit: Payer: 59 | Admitting: Internal Medicine

## 2022-10-24 ENCOUNTER — Ambulatory Visit: Payer: 59 | Admitting: Internal Medicine

## 2022-11-30 ENCOUNTER — Ambulatory Visit (INDEPENDENT_AMBULATORY_CARE_PROVIDER_SITE_OTHER): Payer: 59 | Admitting: Internal Medicine

## 2022-11-30 ENCOUNTER — Encounter: Payer: Self-pay | Admitting: Internal Medicine

## 2022-11-30 VITALS — BP 128/78 | HR 84 | Ht 68.0 in | Wt 254.0 lb

## 2022-11-30 DIAGNOSIS — R7303 Prediabetes: Secondary | ICD-10-CM | POA: Diagnosis not present

## 2022-11-30 DIAGNOSIS — Z1231 Encounter for screening mammogram for malignant neoplasm of breast: Secondary | ICD-10-CM

## 2022-11-30 DIAGNOSIS — I10 Essential (primary) hypertension: Secondary | ICD-10-CM

## 2022-11-30 MED ORDER — LOSARTAN POTASSIUM-HCTZ 100-25 MG PO TABS: 1.0000 | ORAL_TABLET | Freq: Every day | ORAL | 5 refills | Status: DC

## 2022-11-30 MED ORDER — AMLODIPINE BESYLATE 5 MG PO TABS: 5.0000 mg | ORAL_TABLET | Freq: Every day | ORAL | 5 refills | Status: DC

## 2022-11-30 NOTE — Assessment & Plan Note (Signed)
Clinically stable exam with well controlled BP on losartan hct. Tolerating medications without side effects. Pt to continue current regimen and low sodium diet.  

## 2022-11-30 NOTE — Patient Instructions (Signed)
Call [facility] to schedule your mammogram at [phone].  

## 2022-11-30 NOTE — Progress Notes (Signed)
Date:  11/30/2022   Name:  Cheryl Mosley   DOB:  12/26/1960   MRN:  [id number]   Chief Complaint: Hypertension  Hypertension This is a chronic problem. The problem is controlled. Pertinent negatives include no chest pain, headaches, palpitations or shortness of breath. Past treatments include angiotensin blockers and diuretics. The current treatment provides significant improvement. There is no history of kidney disease, CAD/MI or CVA.   Due for annual mammogram next month. She has changed her colonoscopy to 12/06/22 in [place] with Dr. Elbert Ewings.  Lab Results  Component Value Date   NA 140 08/09/2022   K 3.8 08/09/2022   CO2 26 08/09/2022   GLUCOSE 94 08/09/2022   BUN 12 08/09/2022   CREATININE 0.95 08/09/2022   CALCIUM 9.5 08/09/2022   EGFR 69 08/09/2022   GFRNONAA 69 08/03/2020   Lab Results  Component Value Date   CHOL 191 08/09/2022   HDL 51 08/09/2022   LDLCALC 126 (H) 08/09/2022   TRIG 75 08/09/2022   CHOLHDL 3.7 08/09/2022   Lab Results  Component Value Date   TSH 4.550 (H) 08/09/2022   Lab Results  Component Value Date   HGBA1C 5.9 (H) 08/09/2022   Lab Results  Component Value Date   WBC 3.9 08/09/2022   HGB 13.1 08/09/2022   HCT 38.6 08/09/2022   MCV 84 08/09/2022   PLT 164 08/09/2022   Lab Results  Component Value Date   ALT 19 08/09/2022   AST 17 08/09/2022   ALKPHOS 96 08/09/2022   BILITOT 1.2 08/09/2022   No results found for: "25OHVITD2", "25OHVITD3", "VD25OH"   Review of Systems  Constitutional:  Negative for fatigue and unexpected weight change.  HENT:  Negative for nosebleeds.   Eyes:  Negative for visual disturbance.  Respiratory:  Negative for cough, chest tightness, shortness of breath and wheezing.   Cardiovascular:  Negative for chest pain, palpitations and leg swelling.  Gastrointestinal:  Negative for abdominal pain, constipation and diarrhea.  Neurological:  Negative for dizziness, weakness, light-headedness and headaches.     Patient Active Problem List   Diagnosis Date Noted   Prediabetes 11/30/2022   Status post total hysterectomy 01/31/2022   BMI 36.0-36.9,adult 07/25/2018   Cyst of right breast 02/06/2018   Essential (primary) hypertension 06/16/2015   Hyperlipidemia, mixed 06/16/2015   Hx of gastritis 06/14/2015    No Known Allergies  Past Surgical History:  Procedure Laterality Date   CHOLECYSTECTOMY N/A 10/27/2015   Procedure: LAPAROSCOPIC CHOLECYSTECTOMY WITH INTRAOPERATIVE CHOLANGIOGRAM;  Surgeon: Clayburn Pert, MD;  Location: [facility];  Service: General;  Laterality: N/A;   COLONOSCOPY  10/29/2013   normal- cleared for 10 yrs- Dr Allen Norris   ESOPHAGOGASTRODUODENOSCOPY (EGD) WITH PROPOFOL N/A 09/19/2015   Procedure: ESOPHAGOGASTRODUODENOSCOPY (EGD) WITH PROPOFOL;  Surgeon: Lucilla Lame, MD;  Location: [place];  Service: Endoscopy;  Laterality: N/A;   TOTAL ABDOMINAL HYSTERECTOMY  10/30/2003    Social History   Tobacco Use   Smoking status: Never   Smokeless tobacco: Never  Vaping Use   Vaping Use: Never used  Substance Use Topics   Alcohol use: No    Alcohol/week: 0.0 standard drinks of alcohol   Drug use: No     Medication list has been reviewed and updated.  Current Meds  Medication Sig   amLODipine (NORVASC) 5 MG tablet Take 5 mg by mouth daily.   losartan-hydrochlorothiazide (HYZAAR) 100-25 MG tablet Take 1 tablet by mouth daily.       11/30/2022  8:31 AM 08/08/2022    8:56 AM 02/05/2022    9:44 AM 08/07/2021    9:24 AM  GAD 7 : Generalized Anxiety Score  Nervous, Anxious, on Edge 0 0 0 0  Control/stop worrying 0 0 0 0  Worry too much - different things 1 1 0 0  Trouble relaxing 0 0 0 0  Restless 0 0 0 0  Easily annoyed or irritable 1 0 0 0  Afraid - awful might happen 0 0 0 0  Total GAD 7 Score 2 1 0 0  Anxiety Difficulty Not difficult at all Not difficult at all Not difficult at all        11/30/2022    8:31 AM 08/08/2022    8:56 AM 02/05/2022     9:44 AM  Depression screen PHQ 2/9  Decreased Interest 0 0 0  Down, Depressed, Hopeless 0 0 0  PHQ - 2 Score 0 0 0  Altered sleeping 0 0 0  Tired, decreased energy 0 0 0  Change in appetite 0 0 0  Feeling bad or failure about yourself  0 0 0  Trouble concentrating 0 0 0  Moving slowly or fidgety/restless 0 0 0  Suicidal thoughts 0 0 0  PHQ-9 Score 0 0 0  Difficult doing work/chores Not difficult at all Not difficult at all Not difficult at all    BP Readings from Last 3 Encounters:  11/30/22 128/78  08/08/22 (!) 160/102  02/05/22 106/78    Physical Exam Vitals and nursing note reviewed.  Constitutional:      General: She is not in acute distress.    Appearance: She is well-developed.  HENT:     Head: Normocephalic and atraumatic.  Neck:     Vascular: No carotid bruit.  Cardiovascular:     Rate and Rhythm: Normal rate and regular rhythm.  Pulmonary:     Effort: Pulmonary effort is normal. No respiratory distress.     Breath sounds: No wheezing or rhonchi.  Musculoskeletal:     Cervical back: Normal range of motion.  Lymphadenopathy:     Cervical: No cervical adenopathy.  Skin:    General: Skin is warm and dry.     Capillary Refill: Capillary refill takes less than 2 seconds.     Findings: No rash.  Neurological:     General: No focal deficit present.     Mental Status: She is alert and oriented to person, place, and time.  Psychiatric:        Mood and Affect: Mood normal.        Behavior: Behavior normal.     Wt Readings from Last 3 Encounters:  11/30/22 254 lb (115.2 kg)  08/08/22 256 lb (116.1 kg)  02/05/22 250 lb 9.6 oz (113.7 kg)    BP 128/78   Pulse 84   Ht 5\' 8"  (1.727 m)   Wt 254 lb (115.2 kg)   SpO2 100%   BMI 38.62 kg/m   Assessment and Plan: Problem List Items Addressed This Visit       Cardiovascular and Mediastinum   Essential (primary) hypertension - Primary (Chronic)    Clinically stable exam with well controlled BP on losartan  hct. Tolerating medications without side effects. Pt to continue current regimen and low sodium diet.       Relevant Medications   amLODipine (NORVASC) 5 MG tablet   losartan-hydrochlorothiazide (HYZAAR) 100-25 MG tablet     Other   Prediabetes    Stable A1C  around 5.9 on diet and exercise      Other Visit Diagnoses     Encounter for screening mammogram for breast cancer       Relevant Orders   MM 3D SCREEN BREAST BILATERAL        Partially dictated using Editor, commissioning. Any errors are unintentional.  Halina Maidens, MD [facility]  11/30/2022

## 2022-11-30 NOTE — Assessment & Plan Note (Signed)
Stable A1C around 5.9 on diet and exercise

## 2022-12-06 LAB — HM COLONOSCOPY

## 2022-12-14 ENCOUNTER — Ambulatory Visit: Admission: RE | Admit: 2022-12-14 | Payer: 59 | Source: Home / Self Care | Admitting: Gastroenterology

## 2022-12-14 ENCOUNTER — Encounter: Admission: RE | Payer: Self-pay | Source: Home / Self Care

## 2022-12-14 SURGERY — COLONOSCOPY WITH PROPOFOL
Anesthesia: Choice

## 2022-12-18 ENCOUNTER — Encounter: Payer: Self-pay | Admitting: Internal Medicine

## 2023-01-08 ENCOUNTER — Ambulatory Visit
Admission: RE | Admit: 2023-01-08 | Discharge: 2023-01-08 | Disposition: A | Discharge status: Home / Self Care | Payer: 59 | Source: Ambulatory Visit | Attending: Internal Medicine | Admitting: Internal Medicine

## 2023-01-08 DIAGNOSIS — Z1231 Encounter for screening mammogram for malignant neoplasm of breast: Secondary | ICD-10-CM | POA: Insufficient documentation

## 2023-06-14 ENCOUNTER — Encounter: Payer: Self-pay | Admitting: Internal Medicine

## 2023-06-14 ENCOUNTER — Ambulatory Visit (INDEPENDENT_AMBULATORY_CARE_PROVIDER_SITE_OTHER): Payer: 59 | Admitting: Internal Medicine

## 2023-06-14 VITALS — BP 118/72 | HR 83 | Ht 68.0 in | Wt 248.0 lb

## 2023-06-14 DIAGNOSIS — Z1231 Encounter for screening mammogram for malignant neoplasm of breast: Secondary | ICD-10-CM

## 2023-06-14 DIAGNOSIS — Z0000 Encounter for general adult medical examination without abnormal findings: Secondary | ICD-10-CM

## 2023-06-14 DIAGNOSIS — I10 Essential (primary) hypertension: Secondary | ICD-10-CM

## 2023-06-14 DIAGNOSIS — E782 Mixed hyperlipidemia: Secondary | ICD-10-CM | POA: Diagnosis not present

## 2023-06-14 DIAGNOSIS — R7303 Prediabetes: Secondary | ICD-10-CM

## 2023-06-14 LAB — POCT URINALYSIS DIPSTICK
Bilirubin, UA: NEGATIVE
Blood, UA: NEGATIVE
Glucose, UA: NEGATIVE
Ketones, UA: NEGATIVE
Leukocytes, UA: NEGATIVE
Nitrite, UA: NEGATIVE
Protein, UA: NEGATIVE
Spec Grav, UA: 1.015 (ref 1.010–1.025)
Urobilinogen, UA: 0.2 E.U./dL
pH, UA: 5 (ref 5.0–8.0)

## 2023-06-14 MED ORDER — LOSARTAN POTASSIUM-HCTZ 100-25 MG PO TABS
1.0000 | ORAL_TABLET | Freq: Every day | ORAL | 1 refills | Status: DC
Start: 2023-06-14 — End: 2023-12-12

## 2023-06-14 MED ORDER — AMLODIPINE BESYLATE 5 MG PO TABS
5.0000 mg | ORAL_TABLET | Freq: Every day | ORAL | 1 refills | Status: DC
Start: 2023-06-14 — End: 2023-12-12

## 2023-06-14 NOTE — Progress Notes (Signed)
Date:  06/14/2023   Name:  Cheryl Mosley   DOB:  02-May-1961   MRN:  [id number]   Chief Complaint: Annual Exam Cheryl Mosley is a 62 y.o. female who presents today for her Complete Annual Exam. She feels well. She reports exercising - some. She reports she is sleeping well. Breast complaints - none.  Mammogram: 12/2022 DEXA: none Pap smear: discontinued Colonoscopy: 11/2022 repeat 10 yrs  Health Maintenance Due  Topic Date Due   COVID-19 Vaccine (5 - 2023-24 season) 06/29/2022   INFLUENZA VACCINE  05/30/2023    Immunization History  Administered Date(s) Administered   Influenza,inj,Quad PF,6+ Mos 08/29/2015, 08/06/2016, 07/24/2017, 07/25/2018, 07/31/2019, 08/08/2022   PFIZER Comirnaty(Gray Top)Covid-19 Tri-Sucrose Vaccine 06/02/2021   PFIZER(Purple Top)SARS-COV-2 Vaccination 01/09/2020, 02/02/2020, 08/27/2020   Tdap 06/28/2014   Zoster Recombinant(Shingrix) 08/07/2021, 02/05/2022    Hypertension This is a chronic problem. The problem is controlled. Pertinent negatives include no chest pain, headaches, palpitations or shortness of breath. Past treatments include calcium channel blockers, angiotensin blockers and diuretics.    Lab Results  Component Value Date   NA 140 08/09/2022   K 3.8 08/09/2022   CO2 26 08/09/2022   GLUCOSE 94 08/09/2022   BUN 12 08/09/2022   CREATININE 0.95 08/09/2022   CALCIUM 9.5 08/09/2022   EGFR 69 08/09/2022   GFRNONAA 69 08/03/2020   Lab Results  Component Value Date   CHOL 191 08/09/2022   HDL 51 08/09/2022   LDLCALC 126 (H) 08/09/2022   TRIG 75 08/09/2022   CHOLHDL 3.7 08/09/2022   Lab Results  Component Value Date   TSH 4.550 (H) 08/09/2022   Lab Results  Component Value Date   HGBA1C 5.9 (H) 08/09/2022   Lab Results  Component Value Date   WBC 3.9 08/09/2022   HGB 13.1 08/09/2022   HCT 38.6 08/09/2022   MCV 84 08/09/2022   PLT 164 08/09/2022   Lab Results  Component Value Date   ALT 19 08/09/2022   AST 17  08/09/2022   ALKPHOS 96 08/09/2022   BILITOT 1.2 08/09/2022   No results found for: "25OHVITD2", "25OHVITD3", "VD25OH"   Review of Systems  Constitutional:  Negative for chills, fatigue and fever.  HENT:  Negative for congestion, hearing loss, tinnitus, trouble swallowing and voice change.   Eyes:  Negative for visual disturbance.  Respiratory:  Negative for cough, chest tightness, shortness of breath and wheezing.   Cardiovascular:  Negative for chest pain, palpitations and leg swelling.  Gastrointestinal:  Negative for abdominal pain, constipation, diarrhea and vomiting.  Endocrine: Negative for polydipsia and polyuria.  Genitourinary:  Negative for dysuria, frequency, genital sores, vaginal bleeding and vaginal discharge.  Musculoskeletal:  Negative for arthralgias, gait problem and joint swelling.  Skin:  Negative for color change and rash.  Neurological:  Negative for dizziness, tremors, light-headedness and headaches.  Hematological:  Negative for adenopathy. Does not bruise/bleed easily.  Psychiatric/Behavioral:  Negative for dysphoric mood and sleep disturbance. The patient is not nervous/anxious.     Patient Active Problem List   Diagnosis Date Noted   Prediabetes 11/30/2022   Status post total hysterectomy 01/31/2022   BMI 36.0-36.9,adult 07/25/2018   Cyst of right breast 02/06/2018   Essential (primary) hypertension 06/16/2015   Hyperlipidemia, mixed 06/16/2015   Hx of gastritis 06/14/2015    No Known Allergies  Past Surgical History:  Procedure Laterality Date   CHOLECYSTECTOMY N/A 10/27/2015   Procedure: LAPAROSCOPIC CHOLECYSTECTOMY WITH INTRAOPERATIVE CHOLANGIOGRAM;  Surgeon: Ricarda Frame, MD;  Location: [facility];  Service: General;  Laterality: N/A;   COLONOSCOPY  10/29/2013   normal- cleared for 10 yrs- Dr Servando Snare   ESOPHAGOGASTRODUODENOSCOPY (EGD) WITH PROPOFOL N/A 09/19/2015   Procedure: ESOPHAGOGASTRODUODENOSCOPY (EGD) WITH PROPOFOL;  Surgeon: Midge Minium,  MD;  Location: [facility];  Service: Endoscopy;  Laterality: N/A;   TOTAL ABDOMINAL HYSTERECTOMY  10/30/2003    Social History   Tobacco Use   Smoking status: Never   Smokeless tobacco: Never  Vaping Use   Vaping status: Never Used  Substance Use Topics   Alcohol use: No    Alcohol/week: 0.0 standard drinks of alcohol   Drug use: No     Medication list has been reviewed and updated.  Current Meds  Medication Sig   [DISCONTINUED] amLODipine (NORVASC) 5 MG tablet Take 1 tablet (5 mg total) by mouth daily.   [DISCONTINUED] losartan-hydrochlorothiazide (HYZAAR) 100-25 MG tablet Take 1 tablet by mouth daily.       06/14/2023    9:53 AM 11/30/2022    8:31 AM 08/08/2022    8:56 AM 02/05/2022    9:44 AM  GAD 7 : Generalized Anxiety Score  Nervous, Anxious, on Edge 0 0 0 0  Control/stop worrying 0 0 0 0  Worry too much - different things 1 1 1  0  Trouble relaxing 0 0 0 0  Restless 0 0 0 0  Easily annoyed or irritable 0 1 0 0  Afraid - awful might happen 0 0 0 0  Total GAD 7 Score 1 2 1  0  Anxiety Difficulty Not difficult at all Not difficult at all Not difficult at all Not difficult at all       06/14/2023    9:53 AM 11/30/2022    8:31 AM 08/08/2022    8:56 AM  Depression screen PHQ 2/9  Decreased Interest 0 0 0  Down, Depressed, Hopeless 0 0 0  PHQ - 2 Score 0 0 0  Altered sleeping 0 0 0  Tired, decreased energy 0 0 0  Change in appetite 0 0 0  Feeling bad or failure about yourself  0 0 0  Trouble concentrating 0 0 0  Moving slowly or fidgety/restless 0 0 0  Suicidal thoughts 0 0 0  PHQ-9 Score 0 0 0  Difficult doing work/chores Not difficult at all Not difficult at all Not difficult at all    BP Readings from Last 3 Encounters:  06/14/23 118/72  11/30/22 128/78  08/08/22 (!) 160/102    Physical Exam Vitals and nursing note reviewed.  Constitutional:      General: She is not in acute distress.    Appearance: She is well-developed.  HENT:     Head:  Normocephalic and atraumatic.     Right Ear: Tympanic membrane and ear canal normal.     Left Ear: Tympanic membrane and ear canal normal.     Nose:     Right Sinus: No maxillary sinus tenderness.     Left Sinus: No maxillary sinus tenderness.  Eyes:     General: No scleral icterus.       Right eye: No discharge.        Left eye: No discharge.     Conjunctiva/sclera: Conjunctivae normal.  Neck:     Thyroid: No thyromegaly.     Vascular: No carotid bruit.  Cardiovascular:     Rate and Rhythm: Normal rate and regular rhythm.     Pulses: Normal pulses.     Heart sounds:  Normal heart sounds.  Pulmonary:     Effort: Pulmonary effort is normal. No respiratory distress.     Breath sounds: No wheezing.  Chest:  Breasts:    Right: No mass, nipple discharge, skin change or tenderness.     Left: No mass, nipple discharge, skin change or tenderness.  Abdominal:     General: Bowel sounds are normal.     Palpations: Abdomen is soft.     Tenderness: There is no abdominal tenderness.  Musculoskeletal:     Cervical back: Normal range of motion. No erythema.     Right lower leg: No edema.     Left lower leg: No edema.  Lymphadenopathy:     Cervical: No cervical adenopathy.  Skin:    General: Skin is warm and dry.     Findings: No rash.  Neurological:     Mental Status: She is alert and oriented to person, place, and time.     Cranial Nerves: No cranial nerve deficit.     Sensory: No sensory deficit.     Deep Tendon Reflexes: Reflexes are normal and symmetric.  Psychiatric:        Attention and Perception: Attention normal.        Mood and Affect: Mood normal.     Wt Readings from Last 3 Encounters:  06/14/23 248 lb (112.5 kg)  11/30/22 254 lb (115.2 kg)  08/08/22 256 lb (116.1 kg)    BP 118/72   Pulse 83   Ht 5\' 8"  (1.727 m)   Wt 248 lb (112.5 kg)   SpO2 98%   BMI 37.71 kg/m   Assessment and Plan:  Problem List Items Addressed This Visit       Unprioritized    Prediabetes    Glucoses controlled with diet alone      Relevant Orders   Hemoglobin A1c   Hyperlipidemia, mixed (Chronic)    Lipids managed with diet and exercise Lab Results  Component Value Date   LDLCALC 126 (H) 08/09/2022         Relevant Medications   amLODipine (NORVASC) 5 MG tablet   losartan-hydrochlorothiazide (HYZAAR) 100-25 MG tablet   Other Relevant Orders   Lipid panel   Essential (primary) hypertension (Chronic)    Normal exam with stable BP on amlodipine and losartan hct. No concerns or side effects to current medication. No change in regimen; continue low sodium diet.       Relevant Medications   amLODipine (NORVASC) 5 MG tablet   losartan-hydrochlorothiazide (HYZAAR) 100-25 MG tablet   Other Relevant Orders   CBC with Differential/Platelet   Comprehensive metabolic panel   TSH   POCT urinalysis dipstick (Completed)   Other Visit Diagnoses     Annual physical exam    -  Primary   Relevant Orders   CBC with Differential/Platelet   Comprehensive metabolic panel   Hemoglobin A1c   Lipid panel   TSH   POCT urinalysis dipstick (Completed)   Encounter for screening mammogram for breast cancer       Relevant Orders   MM 3D SCREENING MAMMOGRAM BILATERAL BREAST       Return in about 6 months (around 12/15/2023) for HTN.    Reubin Milan, MD [facility] [place]

## 2023-06-14 NOTE — Assessment & Plan Note (Signed)
Normal exam with stable BP on amlodipine and losartan hct. No concerns or side effects to current medication. No change in regimen; continue low sodium diet.

## 2023-06-14 NOTE — Assessment & Plan Note (Signed)
Lipids managed with diet and exercise Lab Results  Component Value Date   LDLCALC 126 (H) 08/09/2022

## 2023-06-14 NOTE — Assessment & Plan Note (Signed)
Glucoses controlled with diet alone

## 2023-06-15 LAB — COMPREHENSIVE METABOLIC PANEL
ALT: 20 IU/L (ref 0–32)
AST: 23 IU/L (ref 0–40)
Albumin: 4 g/dL (ref 3.9–4.9)
Alkaline Phosphatase: 100 IU/L (ref 44–121)
BUN/Creatinine Ratio: 16 (ref 12–28)
BUN: 14 mg/dL (ref 8–27)
Bilirubin Total: 0.9 mg/dL (ref 0.0–1.2)
CO2: 27 mmol/L (ref 20–29)
Calcium: 9.3 mg/dL (ref 8.7–10.3)
Chloride: 102 mmol/L (ref 96–106)
Creatinine, Ser: 0.87 mg/dL (ref 0.57–1.00)
Globulin, Total: 3.1 g/dL (ref 1.5–4.5)
Glucose: 88 mg/dL (ref 70–99)
Potassium: 4.1 mmol/L (ref 3.5–5.2)
Sodium: 140 mmol/L (ref 134–144)
Total Protein: 7.1 g/dL (ref 6.0–8.5)
eGFR: 76 mL/min/{1.73_m2} (ref >59)

## 2023-06-15 LAB — CBC WITH DIFFERENTIAL/PLATELET
Basophils Absolute: 0 10*3/uL (ref 0.0–0.2)
Basos: 0 %
EOS (ABSOLUTE): 0.1 10*3/uL (ref 0.0–0.4)
Eos: 3 %
Hematocrit: 37 % (ref 34.0–46.6)
Hemoglobin: 11.9 g/dL (ref 11.1–15.9)
Immature Grans (Abs): 0 10*3/uL (ref 0.0–0.1)
Immature Granulocytes: 0 %
Lymphocytes Absolute: 1.7 10*3/uL (ref 0.7–3.1)
Lymphs: 36 %
MCH: 27.4 pg (ref 26.6–33.0)
MCHC: 32.2 g/dL (ref 31.5–35.7)
MCV: 85 fL (ref 79–97)
Monocytes Absolute: 0.5 10*3/uL (ref 0.1–0.9)
Monocytes: 10 %
Neutrophils Absolute: 2.4 10*3/uL (ref 1.4–7.0)
Neutrophils: 51 %
Platelets: 167 10*3/uL (ref 150–450)
RBC: 4.34 x10E6/uL (ref 3.77–5.28)
RDW: 14 % (ref 11.7–15.4)
WBC: 4.8 10*3/uL (ref 3.4–10.8)

## 2023-06-15 LAB — LIPID PANEL
Chol/HDL Ratio: 3.2 ratio (ref 0.0–4.4)
Cholesterol, Total: 183 mg/dL (ref 100–199)
HDL: 57 mg/dL (ref >39)
LDL Chol Calc (NIH): 112 mg/dL — ABNORMAL HIGH (ref 0–99)
Triglycerides: 77 mg/dL (ref 0–149)
VLDL Cholesterol Cal: 14 mg/dL (ref 5–40)

## 2023-06-15 LAB — HEMOGLOBIN A1C
Est. average glucose Bld gHb Est-mCnc: 123 mg/dL
Hgb A1c MFr Bld: 5.9 % — ABNORMAL HIGH (ref 4.8–5.6)

## 2023-06-15 LAB — TSH: TSH: 2.65 u[IU]/mL (ref 0.450–4.500)

## 2023-08-08 ENCOUNTER — Encounter: Payer: 59 | Admitting: Internal Medicine

## 2023-08-16 ENCOUNTER — Encounter: Payer: 59 | Admitting: Internal Medicine

## 2023-11-29 ENCOUNTER — Ambulatory Visit: Payer: Self-pay

## 2023-11-29 NOTE — Telephone Encounter (Signed)
Summary: body aches & cough   Pt states she is not feelin so well, pt has been having body aches and cough. Pt is taking coricidin at the moment and would like to know what else she can take.  Pt seeking clinical advice     Chief Complaint: Dry cough, body aches, fatigue. Taking Coricidin. Currently at work. Asking what else she can take with her HTN. Symptoms: Above Frequency: Yesterday Pertinent Negatives: Patient denies fever Disposition: [] ED /[] Urgent Care (no appt availability in office) / [] Appointment(In office/virtual)/ []  [facility]/ [x] Home Care/ [] Refused Recommended Disposition /[] [place]/ [x]  Follow-up with PCP Additional Notes: Please advise pt. Thanks.  Reason for Disposition  Cough with cold symptoms (e.g., runny nose, postnasal drip, throat clearing)  Answer Assessment - Initial Assessment Questions 1. ONSET: "When did the cough begin?"      Yesterday 2. SEVERITY: "How bad is the cough today?"      Moderate 3. SPUTUM: "Describe the color of your sputum" (none, dry cough; clear, white, yellow, green)     None 4. HEMOPTYSIS: "Are you coughing up any blood?" If so ask: "How much?" (flecks, streaks, tablespoons, etc.)     No 5. DIFFICULTY BREATHING: "Are you having difficulty breathing?" If Yes, ask: "How bad is it?" (e.g., mild, moderate, severe)    - MILD: No SOB at rest, mild SOB with walking, speaks normally in sentences, can lie down, no retractions, pulse < 100.    - MODERATE: SOB at rest, SOB with minimal exertion and prefers to sit, cannot lie down flat, speaks in phrases, mild retractions, audible wheezing, pulse 100-120.    - SEVERE: Very SOB at rest, speaks in single words, struggling to breathe, sitting hunched forward, retractions, pulse > 120      No 6. FEVER: "Do you have a fever?" If Yes, ask: "What is your temperature, how was it measured, and when did it start?"     No 7. CARDIAC HISTORY: "Do you have any history of heart  disease?" (e.g., heart attack, congestive heart failure)      No 8. LUNG HISTORY: "Do you have any history of lung disease?"  (e.g., pulmonary embolus, asthma, emphysema)     No 9. PE RISK FACTORS: "Do you have a history of blood clots?" (or: recent major surgery, recent prolonged travel, bedridden)     No 10. OTHER SYMPTOMS: "Do you have any other symptoms?" (e.g., runny nose, wheezing, chest pain)       Body ache, fatigue 11. PREGNANCY: "Is there any chance you are pregnant?" "When was your last menstrual period?"       No 12. TRAVEL: "Have you traveled out of the country in the last month?" (e.g., travel history, exposures)       No  Protocols used: Cough - Acute Non-Productive-A-AH

## 2023-12-12 ENCOUNTER — Ambulatory Visit (INDEPENDENT_AMBULATORY_CARE_PROVIDER_SITE_OTHER): Payer: 59 | Admitting: Internal Medicine

## 2023-12-12 ENCOUNTER — Encounter: Payer: Self-pay | Admitting: Internal Medicine

## 2023-12-12 ENCOUNTER — Other Ambulatory Visit: Payer: Self-pay | Admitting: Internal Medicine

## 2023-12-12 VITALS — BP 122/78 | HR 87 | Ht 68.0 in | Wt 251.0 lb

## 2023-12-12 DIAGNOSIS — I10 Essential (primary) hypertension: Secondary | ICD-10-CM

## 2023-12-12 DIAGNOSIS — R7303 Prediabetes: Secondary | ICD-10-CM

## 2023-12-12 MED ORDER — LOSARTAN POTASSIUM-HCTZ 100-25 MG PO TABS
1.0000 | ORAL_TABLET | Freq: Every day | ORAL | 1 refills | Status: DC
Start: 2023-12-12 — End: 2024-07-16

## 2023-12-12 MED ORDER — AMLODIPINE BESYLATE 5 MG PO TABS
5.0000 mg | ORAL_TABLET | Freq: Every day | ORAL | 1 refills | Status: DC
Start: 2023-12-12 — End: 2024-07-16

## 2023-12-12 NOTE — Progress Notes (Signed)
Date:  12/12/2023   Name:  Cheryl Mosley   DOB:  12/24/1960   MRN:  [id number]   Chief Complaint: Hypertension  Hypertension This is a chronic problem. The problem is controlled. Pertinent negatives include no chest pain, headaches, palpitations or shortness of breath. Past treatments include angiotensin blockers, calcium channel blockers and diuretics. The current treatment provides significant improvement.    Review of Systems  Constitutional:  Negative for fatigue and unexpected weight change.  HENT:  Negative for nosebleeds.   Eyes:  Negative for visual disturbance.  Respiratory:  Negative for cough, chest tightness, shortness of breath and wheezing.   Cardiovascular:  Negative for chest pain, palpitations and leg swelling.  Gastrointestinal:  Negative for abdominal pain, constipation and diarrhea.  Neurological:  Negative for dizziness, weakness, light-headedness and headaches.  Psychiatric/Behavioral:  Negative for dysphoric mood and sleep disturbance. The patient is not nervous/anxious.      Lab Results  Component Value Date   NA 140 06/14/2023   K 4.1 06/14/2023   CO2 27 06/14/2023   GLUCOSE 88 06/14/2023   BUN 14 06/14/2023   CREATININE 0.87 06/14/2023   CALCIUM 9.3 06/14/2023   EGFR 76 06/14/2023   GFRNONAA 69 08/03/2020   Lab Results  Component Value Date   CHOL 183 06/14/2023   HDL 57 06/14/2023   LDLCALC 112 (H) 06/14/2023   TRIG 77 06/14/2023   CHOLHDL 3.2 06/14/2023   Lab Results  Component Value Date   TSH 2.650 06/14/2023   Lab Results  Component Value Date   HGBA1C 5.9 (H) 06/14/2023   Lab Results  Component Value Date   WBC 4.8 06/14/2023   HGB 11.9 06/14/2023   HCT 37.0 06/14/2023   MCV 85 06/14/2023   PLT 167 06/14/2023   Lab Results  Component Value Date   ALT 20 06/14/2023   AST 23 06/14/2023   ALKPHOS 100 06/14/2023   BILITOT 0.9 06/14/2023   No results found for: "25OHVITD2", "25OHVITD3", "VD25OH"   Patient Active  Problem List   Diagnosis Date Noted   Prediabetes 11/30/2022   Status post total hysterectomy 01/31/2022   BMI 36.0-36.9,adult 07/25/2018   Cyst of right breast 02/06/2018   Essential (primary) hypertension 06/16/2015   Hyperlipidemia, mixed 06/16/2015   Hx of gastritis 06/14/2015    No Known Allergies  Past Surgical History:  Procedure Laterality Date   CHOLECYSTECTOMY N/A 10/27/2015   Procedure: LAPAROSCOPIC CHOLECYSTECTOMY WITH INTRAOPERATIVE CHOLANGIOGRAM;  Surgeon: Ricarda Frame, MD;  Location: [facility];  Service: General;  Laterality: N/A;   COLONOSCOPY  10/29/2013   normal- cleared for 10 yrs- Dr Servando Snare   ESOPHAGOGASTRODUODENOSCOPY (EGD) WITH PROPOFOL N/A 09/19/2015   Procedure: ESOPHAGOGASTRODUODENOSCOPY (EGD) WITH PROPOFOL;  Surgeon: Midge Minium, MD;  Location: [facility];  Service: Endoscopy;  Laterality: N/A;   TOTAL ABDOMINAL HYSTERECTOMY  10/30/2003    Social History   Tobacco Use   Smoking status: Never   Smokeless tobacco: Never  Vaping Use   Vaping status: Never Used  Substance Use Topics   Alcohol use: No    Alcohol/week: 0.0 standard drinks of alcohol   Drug use: No     Medication list has been reviewed and updated.  Current Meds  Medication Sig   amLODipine (NORVASC) 5 MG tablet Take 1 tablet (5 mg total) by mouth daily.   losartan-hydrochlorothiazide (HYZAAR) 100-25 MG tablet Take 1 tablet by mouth daily.       12/12/2023    8:43 AM 06/14/2023  9:53 AM 11/30/2022    8:31 AM 08/08/2022    8:56 AM  GAD 7 : Generalized Anxiety Score  Nervous, Anxious, on Edge 0 0 0 0  Control/stop worrying 0 0 0 0  Worry too much - different things 0 1 1 1   Trouble relaxing 0 0 0 0  Restless 0 0 0 0  Easily annoyed or irritable 0 0 1 0  Afraid - awful might happen 0 0 0 0  Total GAD 7 Score 0 1 2 1   Anxiety Difficulty Not difficult at all Not difficult at all Not difficult at all Not difficult at all       12/12/2023    8:43 AM 06/14/2023     9:53 AM 11/30/2022    8:31 AM  Depression screen PHQ 2/9  Decreased Interest 0 0 0  Down, Depressed, Hopeless 0 0 0  PHQ - 2 Score 0 0 0  Altered sleeping  0 0  Tired, decreased energy  0 0  Change in appetite  0 0  Feeling bad or failure about yourself   0 0  Trouble concentrating  0 0  Moving slowly or fidgety/restless  0 0  Suicidal thoughts  0 0  PHQ-9 Score  0 0  Difficult doing work/chores  Not difficult at all Not difficult at all    BP Readings from Last 3 Encounters:  12/12/23 122/78  06/14/23 118/72  11/30/22 128/78    Physical Exam Vitals and nursing note reviewed.  Constitutional:      General: She is not in acute distress.    Appearance: She is well-developed.  HENT:     Head: Normocephalic and atraumatic.  Cardiovascular:     Rate and Rhythm: Normal rate and regular rhythm.     Pulses: Normal pulses.     Heart sounds: No murmur heard. Pulmonary:     Effort: Pulmonary effort is normal. No respiratory distress.     Breath sounds: No wheezing or rhonchi.  Musculoskeletal:     Cervical back: Normal range of motion.     Right lower leg: No edema.     Left lower leg: No edema.  Skin:    General: Skin is warm and dry.     Findings: No rash.  Neurological:     General: No focal deficit present.     Mental Status: She is alert and oriented to person, place, and time.  Psychiatric:        Mood and Affect: Mood normal.        Behavior: Behavior normal.     Wt Readings from Last 3 Encounters:  12/12/23 251 lb (113.9 kg)  06/14/23 248 lb (112.5 kg)  11/30/22 254 lb (115.2 kg)    BP 122/78   Pulse 87   Ht 5\' 8"  (1.727 m)   Wt 251 lb (113.9 kg)   SpO2 98%   BMI 38.16 kg/m   Assessment and Plan:  Problem List Items Addressed This Visit       Unprioritized   Essential (primary) hypertension - Primary (Chronic)   Controlled BP with normal exam. Current regimen is losartan hct and amlodipine. Will continue same medications; encourage continued  reduced sodium diet.       Relevant Medications   losartan-hydrochlorothiazide (HYZAAR) 100-25 MG tablet   amLODipine (NORVASC) 5 MG tablet   Prediabetes   Managed with diet and exercise.        Return in about 6 months (around 06/10/2024) for CPX.  Reubin Milan, MD [facility] [place]

## 2023-12-12 NOTE — Assessment & Plan Note (Signed)
Managed with diet and exercise.

## 2023-12-12 NOTE — Assessment & Plan Note (Signed)
Controlled BP with normal exam. Current regimen is losartan hct and amlodipine. Will continue same medications; encourage continued reduced sodium diet.

## 2023-12-13 LAB — HM DIABETES EYE EXAM

## 2024-01-09 ENCOUNTER — Ambulatory Visit
Admission: RE | Admit: 2024-01-09 | Discharge: 2024-01-09 | Disposition: A | Discharge status: Home / Self Care | Payer: 59 | Source: Ambulatory Visit | Attending: Internal Medicine | Admitting: Internal Medicine

## 2024-01-09 DIAGNOSIS — Z1231 Encounter for screening mammogram for malignant neoplasm of breast: Secondary | ICD-10-CM | POA: Insufficient documentation

## 2024-06-10 ENCOUNTER — Encounter: Payer: 59 | Admitting: Internal Medicine

## 2024-06-10 ENCOUNTER — Encounter: Payer: Self-pay | Admitting: Internal Medicine

## 2024-06-10 NOTE — Assessment & Plan Note (Deleted)
 Blood pressure is well controlled on losartan , amlodipine  and hydrochlorothiazide . No medication side effects noted. Plan to continue current medications.

## 2024-06-10 NOTE — Assessment & Plan Note (Deleted)
 Managed with diet and exercise. Lab Results  Component Value Date   HGBA1C 5.9 (H) 06/14/2023

## 2024-06-10 NOTE — Progress Notes (Deleted)
 Date:  06/10/2024   Name:  Cheryl Mosley   DOB:  09-11-1961   MRN:  [id number]   Chief Complaint: No chief complaint on file. Cheryl Mosley is a 63 y.o. female who presents today for her Complete Annual Exam. She feels {DESC; WELL/FAIRLY WELL/POORLY:18703}. She reports exercising ***. She reports she is sleeping {DESC; WELL/FAIRLY WELL/POORLY:18703}. Breast complaints ***.  Health Maintenance  Topic Date Due   Pneumococcal Vaccine for age over 71 (1 of 1 - PCV) Never done   COVID-19 Vaccine (5 - 2024-25 season) 06/30/2023   Flu Shot  05/29/2024   DTaP/Tdap/Td vaccine (2 - Td or Tdap) 06/28/2024   Mammogram  01/08/2025   Colon Cancer Screening  12/06/2032   Hepatitis C Screening  Completed   HIV Screening  Completed   Zoster (Shingles) Vaccine  Completed   Hepatitis B Vaccine  Aged Out   HPV Vaccine  Aged Out   Meningitis B Vaccine  Aged Out      Hypertension This is a chronic problem. The problem is controlled. Pertinent negatives include no chest pain, headaches, palpitations or shortness of breath. Past treatments include calcium channel blockers, angiotensin blockers and diuretics.  Hyperlipidemia This is a chronic problem. Recent lipid tests were reviewed and are high (mildly elevated LDL). Pertinent negatives include no chest pain, myalgias or shortness of breath.  Diabetes She presents for her follow-up diabetic visit. Diabetes type: prediabetes. Pertinent negatives for hypoglycemia include no dizziness or headaches. Pertinent negatives for diabetes include no chest pain, no fatigue and no weakness.    Review of Systems  Constitutional:  Negative for fatigue and unexpected weight change.  HENT:  Negative for trouble swallowing.   Eyes:  Negative for visual disturbance.  Respiratory:  Negative for cough, chest tightness, shortness of breath and wheezing.   Cardiovascular:  Negative for chest pain, palpitations and leg swelling.  Gastrointestinal:  Negative for  abdominal pain, constipation and diarrhea.  Musculoskeletal:  Negative for arthralgias and myalgias.  Neurological:  Negative for dizziness, weakness, light-headedness and headaches.     Lab Results  Component Value Date   NA 140 06/14/2023   K 4.1 06/14/2023   CO2 27 06/14/2023   GLUCOSE 88 06/14/2023   BUN 14 06/14/2023   CREATININE 0.87 06/14/2023   CALCIUM 9.3 06/14/2023   EGFR 76 06/14/2023   GFRNONAA 69 08/03/2020   Lab Results  Component Value Date   CHOL 183 06/14/2023   HDL 57 06/14/2023   LDLCALC 112 (H) 06/14/2023   TRIG 77 06/14/2023   CHOLHDL 3.2 06/14/2023   Lab Results  Component Value Date   TSH 2.650 06/14/2023   Lab Results  Component Value Date   HGBA1C 5.9 (H) 06/14/2023   Lab Results  Component Value Date   WBC 4.8 06/14/2023   HGB 11.9 06/14/2023   HCT 37.0 06/14/2023   MCV 85 06/14/2023   PLT 167 06/14/2023   Lab Results  Component Value Date   ALT 20 06/14/2023   AST 23 06/14/2023   ALKPHOS 100 06/14/2023   BILITOT 0.9 06/14/2023   No results found for: MARIEN BOLLS, VD25OH   Patient Active Problem List   Diagnosis Date Noted   Prediabetes 11/30/2022   Status post total hysterectomy 01/31/2022   BMI 36.0-36.9,adult 07/25/2018   Cyst of right breast 02/06/2018   Essential (primary) hypertension 06/16/2015   Hyperlipidemia, mixed 06/16/2015   Hx of gastritis 06/14/2015    No Known Allergies  Past Surgical  History:  Procedure Laterality Date   CHOLECYSTECTOMY N/A 10/27/2015   Procedure: LAPAROSCOPIC CHOLECYSTECTOMY WITH INTRAOPERATIVE CHOLANGIOGRAM;  Surgeon: Carlin Pastel, MD;  Location: [facility];  Service: General;  Laterality: N/A;   COLONOSCOPY  10/29/2013   normal- cleared for 10 yrs- Dr Jinny   ESOPHAGOGASTRODUODENOSCOPY (EGD) WITH PROPOFOL  N/A 09/19/2015   Procedure: ESOPHAGOGASTRODUODENOSCOPY (EGD) WITH PROPOFOL ;  Surgeon: Rogelia Jinny, MD;  Location: [facility];  Service: Endoscopy;  Laterality:  N/A;   TOTAL ABDOMINAL HYSTERECTOMY  10/30/2003    Social History   Tobacco Use   Smoking status: Never   Smokeless tobacco: Never  Vaping Use   Vaping status: Never Used  Substance Use Topics   Alcohol use: No    Alcohol/week: 0.0 standard drinks of alcohol   Drug use: No     Medication list has been reviewed and updated.  No outpatient medications have been marked as taking for the 06/10/24 encounter (Appointment) with Justus Leita DEL, MD.       12/12/2023    8:43 AM 06/14/2023    9:53 AM 11/30/2022    8:31 AM 08/08/2022    8:56 AM  GAD 7 : Generalized Anxiety Score  Nervous, Anxious, on Edge 0 0 0 0  Control/stop worrying 0 0 0 0  Worry too much - different things 0 1 1 1   Trouble relaxing 0 0 0 0  Restless 0 0 0 0  Easily annoyed or irritable 0 0 1 0  Afraid - awful might happen 0 0 0 0  Total GAD 7 Score 0 1 2 1   Anxiety Difficulty Not difficult at all Not difficult at all Not difficult at all Not difficult at all       12/12/2023    8:43 AM 06/14/2023    9:53 AM 11/30/2022    8:31 AM  Depression screen PHQ 2/9  Decreased Interest 0 0 0  Down, Depressed, Hopeless 0 0 0  PHQ - 2 Score 0 0 0  Altered sleeping  0 0  Tired, decreased energy  0 0  Change in appetite  0 0  Feeling bad or failure about yourself   0 0  Trouble concentrating  0 0  Moving slowly or fidgety/restless  0 0  Suicidal thoughts  0 0  PHQ-9 Score  0 0  Difficult doing work/chores  Not difficult at all Not difficult at all    BP Readings from Last 3 Encounters:  12/12/23 122/78  06/14/23 118/72  11/30/22 128/78    Physical Exam Vitals and nursing note reviewed.  Constitutional:      General: She is not in acute distress.    Appearance: She is well-developed.  HENT:     Head: Normocephalic and atraumatic.     Right Ear: Tympanic membrane and ear canal normal.     Left Ear: Tympanic membrane and ear canal normal.     Nose:     Right Sinus: No maxillary sinus tenderness.      Left Sinus: No maxillary sinus tenderness.  Eyes:     General: No scleral icterus.       Right eye: No discharge.        Left eye: No discharge.     Conjunctiva/sclera: Conjunctivae normal.  Neck:     Thyroid: No thyromegaly.     Vascular: No carotid bruit.  Cardiovascular:     Rate and Rhythm: Normal rate and regular rhythm.     Pulses: Normal pulses.  Heart sounds: Normal heart sounds.  Pulmonary:     Effort: Pulmonary effort is normal. No respiratory distress.     Breath sounds: No wheezing.  Abdominal:     General: Bowel sounds are normal.     Palpations: Abdomen is soft.     Tenderness: There is no abdominal tenderness.  Musculoskeletal:     Cervical back: Normal range of motion. No erythema.     Right lower leg: No edema.     Left lower leg: No edema.  Lymphadenopathy:     Cervical: No cervical adenopathy.  Skin:    General: Skin is warm and dry.     Findings: No rash.  Neurological:     Mental Status: She is alert and oriented to person, place, and time.     Cranial Nerves: No cranial nerve deficit.     Sensory: No sensory deficit.     Deep Tendon Reflexes: Reflexes are normal and symmetric.  Psychiatric:        Attention and Perception: Attention normal.        Mood and Affect: Mood normal.     Wt Readings from Last 3 Encounters:  12/12/23 251 lb (113.9 kg)  06/14/23 248 lb (112.5 kg)  11/30/22 254 lb (115.2 kg)    There were no vitals taken for this visit.  Assessment and Plan:  Problem List Items Addressed This Visit       Unprioritized   Essential (primary) hypertension (Chronic)   Blood pressure is well controlled on losartan , amlodipine  and hydrochlorothiazide . No medication side effects noted. Plan to continue current medications.       Hyperlipidemia, mixed (Chronic)   Average 10 yr risk noted. Not currently on statin therapy. Will recheck lipids and advise      Prediabetes   Managed with diet and exercise. Lab Results  Component  Value Date   HGBA1C 5.9 (H) 06/14/2023         Other Visit Diagnoses       Annual physical exam    -  Primary     Encounter for screening mammogram for breast cancer           No follow-ups on file.    Leita HILARIO Adie, MD [facility] [place]

## 2024-06-10 NOTE — Assessment & Plan Note (Deleted)
 Average 10 yr risk noted. Not currently on statin therapy. Will recheck lipids and advise

## 2024-07-16 ENCOUNTER — Other Ambulatory Visit: Payer: Self-pay | Admitting: Internal Medicine

## 2024-07-16 DIAGNOSIS — I10 Essential (primary) hypertension: Secondary | ICD-10-CM

## 2024-07-16 NOTE — Telephone Encounter (Unsigned)
 Copied from CRM [id number]. Topic: Clinical - Medication Refill >> Jul 16, 2024 10:24 AM Travis F wrote: Medication: losartan -hydrochlorothiazide  (HYZAAR) 100-25 MG tablet [[id number]], amLODipine  (NORVASC ) 5 MG tablet [[id number]]  Has the patient contacted their pharmacy? Yes  (Agent: If yes, when and what did the pharmacy advise?) Contact office   This is the patient's preferred pharmacy:  [id number] [address] [facility] - [address] [place] [address] Phone: [phone] Fax: [phone]  Is this the correct pharmacy for this prescription? Yes If no, delete pharmacy and type the correct one.   Has the prescription been filled recently? Yes  Is the patient out of the medication? Yes  Has the patient been seen for an appointment in the last year OR does the patient have an upcoming appointment? Yes  Can we respond through MyChart? No  Agent: Please be advised that Rx refills may take up to 3 business days. We ask that you follow-up with your pharmacy.

## 2024-07-17 MED ORDER — LOSARTAN POTASSIUM-HCTZ 100-25 MG PO TABS: 1.0000 | ORAL_TABLET | Freq: Every day | ORAL | 0 refills | Status: DC

## 2024-07-17 MED ORDER — AMLODIPINE BESYLATE 5 MG PO TABS: 5.0000 mg | ORAL_TABLET | Freq: Every day | ORAL | 0 refills | Status: DC

## 2024-07-17 NOTE — Telephone Encounter (Signed)
 Requested Prescriptions  Pending Prescriptions Disp Refills   amLODipine  (NORVASC ) 5 MG tablet 90 tablet 0    Sig: Take 1 tablet (5 mg total) by mouth daily.     Cardiovascular: Calcium Channel Blockers 2 Failed - 07/17/2024 10:30 AM      Failed - Valid encounter within last 6 months    Recent Outpatient Visits           7 months ago Essential (primary) hypertension   [place] [facility] at [facility], Leita DEL, MD              Passed - Last BP in normal range    BP Readings from Last 1 Encounters:  12/12/23 122/78         Passed - Last Heart Rate in normal range    Pulse Readings from Last 1 Encounters:  12/12/23 87          losartan -hydrochlorothiazide  (HYZAAR) 100-25 MG tablet 90 tablet 0    Sig: Take 1 tablet by mouth daily.     Cardiovascular: ARB + Diuretic Combos Failed - 07/17/2024 10:30 AM      Failed - K in normal range and within 180 days    Potassium  Date Value Ref Range Status  06/14/2023 4.1 3.5 - 5.2 mmol/L Final         Failed - Na in normal range and within 180 days    Sodium  Date Value Ref Range Status  06/14/2023 140 134 - 144 mmol/L Final         Failed - Cr in normal range and within 180 days    Creatinine, Ser  Date Value Ref Range Status  06/14/2023 0.87 0.57 - 1.00 mg/dL Final         Failed - eGFR is 10 or above and within 180 days    GFR calc Af Amer  Date Value Ref Range Status  08/03/2020 79 >59 mL/min/1.73 Final    Comment:    **[facility] currently reports eGFR in compliance with the current**   recommendations of the [employer]. [facility] will   update reporting as new guidelines are published from the NKF-ASN   Task force.    GFR calc non Af Amer  Date Value Ref Range Status  08/03/2020 69 >59 mL/min/1.73 Final   eGFR  Date Value Ref Range Status  06/14/2023 76 >59 mL/min/1.73 Final         Failed - Valid encounter within last 6 months    Recent Outpatient Visits            7 months ago Essential (primary) hypertension   [place] [facility] at [facility], Leita DEL, MD              Passed - Patient is not pregnant      Passed - Last BP in normal range    BP Readings from Last 1 Encounters:  12/12/23 122/78

## 2024-08-20 ENCOUNTER — Ambulatory Visit (INDEPENDENT_AMBULATORY_CARE_PROVIDER_SITE_OTHER): Admitting: Internal Medicine

## 2024-08-20 ENCOUNTER — Encounter: Payer: Self-pay | Admitting: Internal Medicine

## 2024-08-20 ENCOUNTER — Ambulatory Visit: Admitting: Internal Medicine

## 2024-08-20 VITALS — BP 124/76 | HR 76 | Ht 68.0 in | Wt 247.0 lb

## 2024-08-20 DIAGNOSIS — R7303 Prediabetes: Secondary | ICD-10-CM

## 2024-08-20 DIAGNOSIS — Z23 Encounter for immunization: Secondary | ICD-10-CM

## 2024-08-20 DIAGNOSIS — Z0000 Encounter for general adult medical examination without abnormal findings: Secondary | ICD-10-CM | POA: Diagnosis not present

## 2024-08-20 DIAGNOSIS — I10 Essential (primary) hypertension: Secondary | ICD-10-CM | POA: Diagnosis not present

## 2024-08-20 DIAGNOSIS — E782 Mixed hyperlipidemia: Secondary | ICD-10-CM | POA: Diagnosis not present

## 2024-08-20 DIAGNOSIS — Z1231 Encounter for screening mammogram for malignant neoplasm of breast: Secondary | ICD-10-CM

## 2024-08-20 MED ORDER — LOSARTAN POTASSIUM-HCTZ 100-25 MG PO TABS: 1.0000 | ORAL_TABLET | Freq: Every day | ORAL | 0 refills | Status: AC

## 2024-08-20 MED ORDER — AMLODIPINE BESYLATE 5 MG PO TABS: 5.0000 mg | ORAL_TABLET | Freq: Every day | ORAL | 0 refills | Status: AC

## 2024-08-20 NOTE — Progress Notes (Signed)
 Date:  08/20/2024   Name:  Cheryl Mosley   DOB:  27-Aug-1961   MRN:  [id number]   Chief Complaint: Annual Exam Cheryl Mosley is a 63 y.o. female who presents today for her Complete Annual Exam. She feels well. She reports exercising walking. She reports she is sleeping well. Breast complaints - none.  Health Maintenance  Topic Date Due   Pneumococcal Vaccine for age over 42 (1 of 1 - PCV) Never done   DTaP/Tdap/Td vaccine (2 - Td or Tdap) 06/28/2024   COVID-19 Vaccine (5 - 2025-26 season) 09/05/2024*   Breast Cancer Screening  01/08/2025   Colon Cancer Screening  12/06/2032   Flu Shot  Completed   Hepatitis C Screening  Completed   HIV Screening  Completed   Zoster (Shingles) Vaccine  Completed   Hepatitis B Vaccine  Aged Out   HPV Vaccine  Aged Out   Meningitis B Vaccine  Aged Out  *Topic was postponed. The date shown is not the original due date.    Hypertension This is a chronic problem. The problem is controlled. Pertinent negatives include no chest pain, headaches, palpitations or shortness of breath. Past treatments include angiotensin blockers, calcium channel blockers and diuretics. There is no history of kidney disease, CAD/MI or CVA.  Hyperlipidemia This is a chronic problem. Recent lipid tests were reviewed and are high. Pertinent negatives include no chest pain, myalgias or shortness of breath. Current antihyperlipidemic treatment includes diet change and exercise.    Review of Systems  Constitutional:  Negative for fatigue and unexpected weight change.  HENT:  Negative for trouble swallowing.   Eyes:  Negative for visual disturbance.  Respiratory:  Negative for cough, chest tightness, shortness of breath and wheezing.   Cardiovascular:  Negative for chest pain, palpitations and leg swelling.  Gastrointestinal:  Negative for abdominal pain, constipation and diarrhea.  Musculoskeletal:  Negative for arthralgias and myalgias.  Neurological:  Negative for  dizziness, weakness, light-headedness and headaches.     Lab Results  Component Value Date   NA 140 06/14/2023   K 4.1 06/14/2023   CO2 27 06/14/2023   GLUCOSE 88 06/14/2023   BUN 14 06/14/2023   CREATININE 0.87 06/14/2023   CALCIUM 9.3 06/14/2023   EGFR 76 06/14/2023   GFRNONAA 69 08/03/2020   Lab Results  Component Value Date   CHOL 183 06/14/2023   HDL 57 06/14/2023   LDLCALC 112 (H) 06/14/2023   TRIG 77 06/14/2023   CHOLHDL 3.2 06/14/2023   Lab Results  Component Value Date   TSH 2.650 06/14/2023   Lab Results  Component Value Date   HGBA1C 5.9 (H) 06/14/2023   Lab Results  Component Value Date   WBC 4.8 06/14/2023   HGB 11.9 06/14/2023   HCT 37.0 06/14/2023   MCV 85 06/14/2023   PLT 167 06/14/2023   Lab Results  Component Value Date   ALT 20 06/14/2023   AST 23 06/14/2023   ALKPHOS 100 06/14/2023   BILITOT 0.9 06/14/2023   No results found for: MARIEN BOLLS, VD25OH   Patient Active Problem List   Diagnosis Date Noted   Prediabetes 11/30/2022   Status post total hysterectomy 01/31/2022   BMI 36.0-36.9,adult 07/25/2018   Cyst of right breast 02/06/2018   Essential (primary) hypertension 06/16/2015   Hyperlipidemia, mixed 06/16/2015   Hx of gastritis 06/14/2015    No Known Allergies  Past Surgical History:  Procedure Laterality Date   CHOLECYSTECTOMY N/A 10/27/2015  Procedure: LAPAROSCOPIC CHOLECYSTECTOMY WITH INTRAOPERATIVE CHOLANGIOGRAM;  Surgeon: Carlin Pastel, MD;  Location: [facility];  Service: General;  Laterality: N/A;   COLONOSCOPY  10/29/2013   normal- cleared for 10 yrs- Dr Jinny   ESOPHAGOGASTRODUODENOSCOPY (EGD) WITH PROPOFOL  N/A 09/19/2015   Procedure: ESOPHAGOGASTRODUODENOSCOPY (EGD) WITH PROPOFOL ;  Surgeon: Rogelia Jinny, MD;  Location: [facility];  Service: Endoscopy;  Laterality: N/A;   TOTAL ABDOMINAL HYSTERECTOMY  10/30/2003    Social History   Tobacco Use   Smoking status: Never   Smokeless  tobacco: Never  Vaping Use   Vaping status: Never Used  Substance Use Topics   Alcohol use: No    Alcohol/week: 0.0 standard drinks of alcohol   Drug use: No     Medication list has been reviewed and updated.  Current Meds  Medication Sig   [DISCONTINUED] amLODipine  (NORVASC ) 5 MG tablet Take 1 tablet (5 mg total) by mouth daily.   [DISCONTINUED] losartan -hydrochlorothiazide  (HYZAAR) 100-25 MG tablet Take 1 tablet by mouth daily.       08/20/2024    8:29 AM 12/12/2023    8:43 AM 06/14/2023    9:53 AM 11/30/2022    8:31 AM  GAD 7 : Generalized Anxiety Score  Nervous, Anxious, on Edge 0 0 0 0  Control/stop worrying 0 0 0 0  Worry too much - different things 0 0 1 1  Trouble relaxing 0 0 0 0  Restless 0 0 0 0  Easily annoyed or irritable 0 0 0 1  Afraid - awful might happen 0 0 0 0  Total GAD 7 Score 0 0 1 2  Anxiety Difficulty Not difficult at all Not difficult at all Not difficult at all Not difficult at all       08/20/2024    8:29 AM 12/12/2023    8:43 AM 06/14/2023    9:53 AM  Depression screen PHQ 2/9  Decreased Interest 0 0 0  Down, Depressed, Hopeless 0 0 0  PHQ - 2 Score 0 0 0  Altered sleeping 0  0  Tired, decreased energy 0  0  Change in appetite 0  0  Feeling bad or failure about yourself  0  0  Trouble concentrating 0  0  Moving slowly or fidgety/restless 0  0  Suicidal thoughts 0  0  PHQ-9 Score 0  0  Difficult doing work/chores Not difficult at all  Not difficult at all    BP Readings from Last 3 Encounters:  08/20/24 124/76  12/12/23 122/78  06/14/23 118/72    Physical Exam Vitals and nursing note reviewed.  Constitutional:      General: She is not in acute distress.    Appearance: She is well-developed.  HENT:     Head: Normocephalic and atraumatic.     Right Ear: Tympanic membrane and ear canal normal.     Left Ear: Tympanic membrane and ear canal normal.     Nose:     Right Sinus: No maxillary sinus tenderness.     Left Sinus: No  maxillary sinus tenderness.  Eyes:     General: No scleral icterus.       Right eye: No discharge.        Left eye: No discharge.     Conjunctiva/sclera: Conjunctivae normal.  Neck:     Thyroid: No thyromegaly.     Vascular: No carotid bruit.  Cardiovascular:     Rate and Rhythm: Normal rate and regular rhythm.     Pulses:  Normal pulses.     Heart sounds: Normal heart sounds.  Pulmonary:     Effort: Pulmonary effort is normal. No respiratory distress.     Breath sounds: No wheezing.  Abdominal:     General: Bowel sounds are normal.     Palpations: Abdomen is soft.     Tenderness: There is no abdominal tenderness.  Musculoskeletal:     Cervical back: Normal range of motion. No erythema.     Right lower leg: No edema.     Left lower leg: No edema.  Lymphadenopathy:     Cervical: No cervical adenopathy.  Skin:    General: Skin is warm and dry.     Findings: No rash.  Neurological:     Mental Status: She is alert and oriented to person, place, and time.     Cranial Nerves: No cranial nerve deficit.     Sensory: No sensory deficit.     Deep Tendon Reflexes: Reflexes are normal and symmetric.  Psychiatric:        Attention and Perception: Attention normal.        Mood and Affect: Mood normal.     Wt Readings from Last 3 Encounters:  08/20/24 247 lb (112 kg)  12/12/23 251 lb (113.9 kg)  06/14/23 248 lb (112.5 kg)    BP 124/76   Pulse 76   Ht 5' 8 (1.727 m)   Wt 247 lb (112 kg)   SpO2 98%   BMI 37.56 kg/m   Assessment and Plan:  Problem List Items Addressed This Visit       Unprioritized   Essential (primary) hypertension (Chronic)   Well controlled blood pressure today. Current regimen is losartan , hydrochlorothiazide  and amlodipine . No medication side effects noted.        Relevant Medications   amLODipine  (NORVASC ) 5 MG tablet   losartan -hydrochlorothiazide  (HYZAAR) 100-25 MG tablet   Other Relevant Orders   CBC with Differential/Platelet    Comprehensive metabolic panel with GFR   TSH   Urinalysis, Routine w reflex microscopic   Hyperlipidemia, mixed (Chronic)   Managed with diet only. Lab Results  Component Value Date   LDLCALC 112 (H) 06/14/2023         Relevant Medications   amLODipine  (NORVASC ) 5 MG tablet   losartan -hydrochlorothiazide  (HYZAAR) 100-25 MG tablet   Other Relevant Orders   Lipid panel   Prediabetes   Controlled with diet only Lab Results  Component Value Date   HGBA1C 5.9 (H) 06/14/2023         Relevant Orders   Hemoglobin A1c   Other Visit Diagnoses       Annual physical exam    -  Primary   CRC screen UTD; Pap d/c'd MM done in March   Relevant Orders   CBC with Differential/Platelet   Comprehensive metabolic panel with GFR   Hemoglobin A1c   Lipid panel   TSH   Urinalysis, Routine w reflex microscopic   MM 3D SCREENING MAMMOGRAM BILATERAL BREAST     Encounter for screening mammogram for breast cancer       Relevant Orders   MM 3D SCREENING MAMMOGRAM BILATERAL BREAST     Encounter for immunization       Relevant Orders   Flu vaccine trivalent PF, 6mos and older(Flulaval,Afluria,Fluarix,Fluzone) (Completed)      She plans to change PCP to a provider closer to home in [place].  No follow-ups on file.   Leita HILARIO Adie, MD [facility] [place]

## 2024-08-20 NOTE — Patient Instructions (Signed)
 Call [facility] to schedule your mammogram at [phone].  Below you will find the link to the [facility] community research project I mentioned through [employer]. This is a free genetic screening opportunity specifically looking for three conditions: 1. Hereditary breast and ovarian cancer syndrome 2. Lynch syndrome (increased risks for colorectal, endometrial and other cancers) 3. Familial hypercholesterolemia (very high cholesterol) We focus on these three conditions because they can occur in the general population, and if you discover you have one of these conditions, there are specific actions you can take to reduce your risk. If you'd like to learn more, I recommend visiting this link: [email or web address]

## 2024-08-20 NOTE — Assessment & Plan Note (Signed)
 Managed with diet only. Lab Results  Component Value Date   LDLCALC 112 (H) 06/14/2023

## 2024-08-20 NOTE — Assessment & Plan Note (Signed)
 Controlled with diet only Lab Results  Component Value Date   HGBA1C 5.9 (H) 06/14/2023

## 2024-08-20 NOTE — Assessment & Plan Note (Signed)
 Well controlled blood pressure today. Current regimen is losartan , hydrochlorothiazide  and amlodipine . No medication side effects noted.

## 2024-08-21 ENCOUNTER — Ambulatory Visit: Payer: Self-pay | Admitting: Internal Medicine

## 2024-08-21 DIAGNOSIS — E782 Mixed hyperlipidemia: Secondary | ICD-10-CM

## 2024-08-21 LAB — URINALYSIS, ROUTINE W REFLEX MICROSCOPIC
Bilirubin, UA: NEGATIVE
Glucose, UA: NEGATIVE
Ketones, UA: NEGATIVE
Nitrite, UA: NEGATIVE
Protein,UA: NEGATIVE
RBC, UA: NEGATIVE
Specific Gravity, UA: 1.01 (ref 1.005–1.030)
Urobilinogen, Ur: 0.2 mg/dL (ref 0.2–1.0)
pH, UA: 7.5 (ref 5.0–7.5)

## 2024-08-21 LAB — CBC WITH DIFFERENTIAL/PLATELET
Basophils Absolute: 0 10*3/uL (ref 0.0–0.2)
Basos: 1 %
EOS (ABSOLUTE): 0.1 10*3/uL (ref 0.0–0.4)
Eos: 2 %
Hematocrit: 42.7 % (ref 34.0–46.6)
Hemoglobin: 13.6 g/dL (ref 11.1–15.9)
Immature Grans (Abs): 0 10*3/uL (ref 0.0–0.1)
Immature Granulocytes: 0 %
Lymphocytes Absolute: 1.5 10*3/uL (ref 0.7–3.1)
Lymphs: 35 %
MCH: 28 pg (ref 26.6–33.0)
MCHC: 31.9 g/dL (ref 31.5–35.7)
MCV: 88 fL (ref 79–97)
Monocytes Absolute: 0.5 10*3/uL (ref 0.1–0.9)
Monocytes: 12 %
Neutrophils Absolute: 2.1 10*3/uL (ref 1.4–7.0)
Neutrophils: 50 %
Platelets: 155 10*3/uL (ref 150–450)
RBC: 4.86 x10E6/uL (ref 3.77–5.28)
RDW: 14.2 % (ref 11.7–15.4)
WBC: 4.2 10*3/uL (ref 3.4–10.8)

## 2024-08-21 LAB — COMPREHENSIVE METABOLIC PANEL WITH GFR
ALT: 17 IU/L (ref 0–32)
AST: 15 IU/L (ref 0–40)
Albumin: 4.3 g/dL (ref 3.9–4.9)
Alkaline Phosphatase: 91 IU/L (ref 49–135)
BUN/Creatinine Ratio: 16 (ref 12–28)
BUN: 15 mg/dL (ref 8–27)
Bilirubin Total: 1.3 mg/dL — ABNORMAL HIGH (ref 0.0–1.2)
CO2: 24 mmol/L (ref 20–29)
Calcium: 10.2 mg/dL (ref 8.7–10.3)
Chloride: 100 mmol/L (ref 96–106)
Creatinine, Ser: 0.94 mg/dL (ref 0.57–1.00)
Globulin, Total: 3.3 g/dL (ref 1.5–4.5)
Glucose: 89 mg/dL (ref 70–99)
Potassium: 4.2 mmol/L (ref 3.5–5.2)
Sodium: 138 mmol/L (ref 134–144)
Total Protein: 7.6 g/dL (ref 6.0–8.5)
eGFR: 69 mL/min/1.73 (ref >59)

## 2024-08-21 LAB — MICROSCOPIC EXAMINATION
Bacteria, UA: NONE SEEN
Casts: NONE SEEN /LPF
RBC, Urine: NONE SEEN /HPF (ref 0–2)
WBC, UA: NONE SEEN /HPF (ref 0–5)

## 2024-08-21 LAB — LIPID PANEL
Chol/HDL Ratio: 3.1 ratio (ref 0.0–4.4)
Cholesterol, Total: 209 mg/dL — ABNORMAL HIGH (ref 100–199)
HDL: 68 mg/dL
LDL Chol Calc (NIH): 127 mg/dL — ABNORMAL HIGH (ref 0–99)
Triglycerides: 79 mg/dL (ref 0–149)
VLDL Cholesterol Cal: 14 mg/dL (ref 5–40)

## 2024-08-21 LAB — HEMOGLOBIN A1C
Est. average glucose Bld gHb Est-mCnc: 117 mg/dL
Hgb A1c MFr Bld: 5.7 % — ABNORMAL HIGH (ref 4.8–5.6)

## 2024-08-21 LAB — TSH: TSH: 2.82 u[IU]/mL (ref 0.450–4.500)

## 2024-08-24 NOTE — Telephone Encounter (Signed)
 PT response.  JM

## 2024-11-25 ENCOUNTER — Telehealth: Payer: Self-pay | Admitting: Internal Medicine

## 2024-11-25 DIAGNOSIS — Z1231 Encounter for screening mammogram for malignant neoplasm of breast: Secondary | ICD-10-CM

## 2024-11-25 NOTE — Telephone Encounter (Signed)
 Okay to place referral?

## 2024-11-25 NOTE — Telephone Encounter (Signed)
 Copied from CRM [id number]. Topic: Referral - Request for Referral >> Nov 25, 2024  3:06 PM Myrick T wrote: Did the patient discuss referral with their provider in the last year? Yes  Appointment offered? Yes, patient says she needs to stay on track with her mammograms and can't wait until the end of March as she is due in February.  Type of order/referral and detailed reason for visit: Mammogram  Preference of office, provider, location: [facility], [place], [phone]  If referral order, have you been seen by this specialty before? Yes, same location  Can we respond through MyChart? Yes

## 2024-11-25 NOTE — Addendum Note (Signed)
 Addended by: CAMACHO OCAMPO, Ichiro Chesnut M on: 11/25/2024 04:03 PM   Modules accepted: Orders

## 2024-11-26 ENCOUNTER — Telehealth: Payer: Self-pay

## 2024-11-26 NOTE — Telephone Encounter (Signed)
 Copied from CRM #[id number]. Topic: Appointments - Transfer of Care >> Nov 26, 2024  9:58 AM [name] wrote: Pt is requesting to transfer FROM: Cheryl Mosley Pt is requesting to transfer TO: nahosi bowa Reason for requested transfer: pt request It is the responsibility of the team the patient would like to transfer to (Dr. bennett) to reach out to the patient if for any reason this transfer is not acceptable.

## 2024-11-26 NOTE — Telephone Encounter (Signed)
 Copied from CRM #[id number]. Topic: Appointments - Transfer of Care >> Nov 26, 2024  9:58 AM [name] wrote: Pt is requesting to transfer FROM: Cheryl Mosley Pt is requesting to transfer TO: Cheryl Mosley Reason for requested transfer: pt request It is the responsibility of the team the patient would like to transfer to (Dr. bennett) to reach out to the patient if for any reason this transfer is not acceptable.

## 2024-11-30 NOTE — Telephone Encounter (Signed)
 Ok for [employer]

## 2025-01-11 ENCOUNTER — Encounter

## 2025-01-26 ENCOUNTER — Encounter

## 2025-02-02 ENCOUNTER — Encounter: Admitting: Student
